# Patient Record
Sex: Male | Born: 1985 | Race: White | Hispanic: No | Marital: Married | Smoking: Never smoker
Health system: Southern US, Community
[De-identification: ages and names within clinical notes are randomized; demographics above are authoritative.]

## PROBLEM LIST (undated history)

## (undated) DIAGNOSIS — I10 Essential (primary) hypertension: Secondary | ICD-10-CM

## (undated) DIAGNOSIS — D497 Neoplasm of unspecified behavior of endocrine glands and other parts of nervous system: Secondary | ICD-10-CM

## (undated) HISTORY — PX: HAND TENDON SURGERY: SHX663

---
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
## 2007-06-28 ENCOUNTER — Ambulatory Visit: Payer: Self-pay | Admitting: Family Medicine

## 2007-06-28 DIAGNOSIS — J029 Acute pharyngitis, unspecified: Secondary | ICD-10-CM | POA: Insufficient documentation

## 2007-06-28 LAB — CONVERTED CEMR LAB: Rapid Strep: NEGATIVE

## 2007-07-09 ENCOUNTER — Ambulatory Visit: Payer: Self-pay | Admitting: Family Medicine

## 2007-07-09 ENCOUNTER — Encounter: Admission: RE | Admit: 2007-07-09 | Discharge: 2007-07-09 | Payer: Self-pay | Admitting: Family Medicine

## 2007-07-09 DIAGNOSIS — S6990XA Unspecified injury of unspecified wrist, hand and finger(s), initial encounter: Secondary | ICD-10-CM

## 2007-07-09 DIAGNOSIS — S6980XA Other specified injuries of unspecified wrist, hand and finger(s), initial encounter: Secondary | ICD-10-CM

## 2007-07-10 ENCOUNTER — Telehealth: Payer: Self-pay | Admitting: *Deleted

## 2007-07-17 ENCOUNTER — Encounter (INDEPENDENT_AMBULATORY_CARE_PROVIDER_SITE_OTHER): Payer: Self-pay | Admitting: *Deleted

## 2007-09-21 ENCOUNTER — Emergency Department (HOSPITAL_COMMUNITY): Admission: EM | Admit: 2007-09-21 | Discharge: 2007-09-22 | Payer: Self-pay | Admitting: Emergency Medicine

## 2007-12-19 ENCOUNTER — Ambulatory Visit: Payer: Self-pay | Admitting: Family Medicine

## 2008-02-04 ENCOUNTER — Encounter: Payer: Self-pay | Admitting: Family Medicine

## 2008-02-04 ENCOUNTER — Ambulatory Visit: Payer: Self-pay | Admitting: Family Medicine

## 2008-02-04 DIAGNOSIS — H6500 Acute serous otitis media, unspecified ear: Secondary | ICD-10-CM

## 2008-03-10 ENCOUNTER — Ambulatory Visit: Payer: Self-pay | Admitting: Family Medicine

## 2008-03-10 DIAGNOSIS — J1089 Influenza due to other identified influenza virus with other manifestations: Secondary | ICD-10-CM | POA: Insufficient documentation

## 2008-04-07 ENCOUNTER — Ambulatory Visit: Payer: Self-pay | Admitting: Family Medicine

## 2008-04-07 ENCOUNTER — Telehealth: Payer: Self-pay | Admitting: *Deleted

## 2008-04-07 DIAGNOSIS — M546 Pain in thoracic spine: Secondary | ICD-10-CM

## 2008-04-15 ENCOUNTER — Telehealth: Payer: Self-pay | Admitting: Family Medicine

## 2008-04-15 ENCOUNTER — Ambulatory Visit: Payer: Self-pay | Admitting: Family Medicine

## 2008-05-22 ENCOUNTER — Ambulatory Visit: Payer: Self-pay | Admitting: Family Medicine

## 2008-05-22 DIAGNOSIS — H02849 Edema of unspecified eye, unspecified eyelid: Secondary | ICD-10-CM | POA: Insufficient documentation

## 2008-06-16 ENCOUNTER — Ambulatory Visit: Payer: Self-pay | Admitting: Family Medicine

## 2008-06-16 DIAGNOSIS — M25519 Pain in unspecified shoulder: Secondary | ICD-10-CM

## 2008-06-18 ENCOUNTER — Encounter (INDEPENDENT_AMBULATORY_CARE_PROVIDER_SITE_OTHER): Payer: Self-pay | Admitting: *Deleted

## 2008-06-18 ENCOUNTER — Ambulatory Visit: Payer: Self-pay | Admitting: Sports Medicine

## 2008-06-18 DIAGNOSIS — M75120 Complete rotator cuff tear or rupture of unspecified shoulder, not specified as traumatic: Secondary | ICD-10-CM | POA: Insufficient documentation

## 2008-06-24 ENCOUNTER — Ambulatory Visit: Payer: Self-pay | Admitting: Sports Medicine

## 2008-07-14 ENCOUNTER — Ambulatory Visit: Payer: Self-pay | Admitting: Family Medicine

## 2008-07-14 DIAGNOSIS — R519 Headache, unspecified: Secondary | ICD-10-CM | POA: Insufficient documentation

## 2008-07-14 DIAGNOSIS — R51 Headache: Secondary | ICD-10-CM

## 2008-07-14 DIAGNOSIS — R030 Elevated blood-pressure reading, without diagnosis of hypertension: Secondary | ICD-10-CM

## 2008-08-10 ENCOUNTER — Telehealth: Payer: Self-pay | Admitting: Family Medicine

## 2008-09-11 ENCOUNTER — Ambulatory Visit: Payer: Self-pay | Admitting: Family Medicine

## 2008-09-11 DIAGNOSIS — H1045 Other chronic allergic conjunctivitis: Secondary | ICD-10-CM

## 2008-11-10 ENCOUNTER — Ambulatory Visit: Payer: Self-pay | Admitting: Family Medicine

## 2008-11-10 DIAGNOSIS — L0292 Furuncle, unspecified: Secondary | ICD-10-CM | POA: Insufficient documentation

## 2008-11-10 DIAGNOSIS — H9190 Unspecified hearing loss, unspecified ear: Secondary | ICD-10-CM | POA: Insufficient documentation

## 2008-11-10 DIAGNOSIS — L0293 Carbuncle, unspecified: Secondary | ICD-10-CM

## 2008-11-12 ENCOUNTER — Telehealth: Payer: Self-pay | Admitting: Family Medicine

## 2009-03-03 ENCOUNTER — Emergency Department (HOSPITAL_COMMUNITY): Admission: EM | Admit: 2009-03-03 | Discharge: 2009-03-03 | Payer: Self-pay | Admitting: Emergency Medicine

## 2009-12-22 ENCOUNTER — Emergency Department (HOSPITAL_COMMUNITY): Admission: EM | Admit: 2009-12-22 | Discharge: 2009-12-22 | Payer: Self-pay | Admitting: Emergency Medicine

## 2010-03-22 ENCOUNTER — Emergency Department (HOSPITAL_COMMUNITY)
Admission: EM | Admit: 2010-03-22 | Discharge: 2010-03-22 | Payer: Self-pay | Source: Home / Self Care | Admitting: Emergency Medicine

## 2010-05-07 ENCOUNTER — Emergency Department (HOSPITAL_COMMUNITY)
Admission: EM | Admit: 2010-05-07 | Discharge: 2010-05-07 | Disposition: A | Discharge status: Home / Self Care | Payer: Self-pay | Attending: Emergency Medicine | Admitting: Emergency Medicine

## 2010-05-07 DIAGNOSIS — M719 Bursopathy, unspecified: Secondary | ICD-10-CM | POA: Insufficient documentation

## 2010-05-07 DIAGNOSIS — I10 Essential (primary) hypertension: Secondary | ICD-10-CM | POA: Insufficient documentation

## 2010-05-07 DIAGNOSIS — M25519 Pain in unspecified shoulder: Secondary | ICD-10-CM | POA: Insufficient documentation

## 2010-05-07 DIAGNOSIS — M67919 Unspecified disorder of synovium and tendon, unspecified shoulder: Secondary | ICD-10-CM | POA: Insufficient documentation

## 2010-05-21 ENCOUNTER — Emergency Department (HOSPITAL_COMMUNITY): Payer: Self-pay

## 2010-05-21 ENCOUNTER — Emergency Department (HOSPITAL_COMMUNITY)
Admission: EM | Admit: 2010-05-21 | Discharge: 2010-05-21 | Disposition: A | Discharge status: Home / Self Care | Payer: Self-pay | Attending: Emergency Medicine | Admitting: Emergency Medicine

## 2010-05-21 DIAGNOSIS — S63509A Unspecified sprain of unspecified wrist, initial encounter: Secondary | ICD-10-CM | POA: Insufficient documentation

## 2010-05-21 DIAGNOSIS — IMO0002 Reserved for concepts with insufficient information to code with codable children: Secondary | ICD-10-CM | POA: Insufficient documentation

## 2010-06-30 ENCOUNTER — Emergency Department (HOSPITAL_COMMUNITY): Payer: Self-pay

## 2010-06-30 ENCOUNTER — Emergency Department (HOSPITAL_COMMUNITY)
Admission: EM | Admit: 2010-06-30 | Discharge: 2010-06-30 | Disposition: A | Discharge status: Home / Self Care | Payer: Self-pay | Attending: Emergency Medicine | Admitting: Emergency Medicine

## 2010-06-30 DIAGNOSIS — IMO0002 Reserved for concepts with insufficient information to code with codable children: Secondary | ICD-10-CM | POA: Insufficient documentation

## 2010-06-30 DIAGNOSIS — I10 Essential (primary) hypertension: Secondary | ICD-10-CM | POA: Insufficient documentation

## 2010-06-30 DIAGNOSIS — M25519 Pain in unspecified shoulder: Secondary | ICD-10-CM | POA: Insufficient documentation

## 2010-06-30 DIAGNOSIS — X58XXXA Exposure to other specified factors, initial encounter: Secondary | ICD-10-CM | POA: Insufficient documentation

## 2010-06-30 DIAGNOSIS — E669 Obesity, unspecified: Secondary | ICD-10-CM | POA: Insufficient documentation

## 2010-07-25 ENCOUNTER — Emergency Department (HOSPITAL_COMMUNITY)
Admission: EM | Admit: 2010-07-25 | Discharge: 2010-07-26 | Disposition: A | Discharge status: Home / Self Care | Payer: Self-pay | Attending: Emergency Medicine | Admitting: Emergency Medicine

## 2010-07-25 DIAGNOSIS — M549 Dorsalgia, unspecified: Secondary | ICD-10-CM | POA: Insufficient documentation

## 2010-07-25 DIAGNOSIS — Y9289 Other specified places as the place of occurrence of the external cause: Secondary | ICD-10-CM | POA: Insufficient documentation

## 2010-07-25 DIAGNOSIS — IMO0002 Reserved for concepts with insufficient information to code with codable children: Secondary | ICD-10-CM | POA: Insufficient documentation

## 2010-10-28 ENCOUNTER — Emergency Department: Payer: Self-pay | Admitting: Emergency Medicine

## 2011-03-04 ENCOUNTER — Emergency Department: Payer: Self-pay | Admitting: Emergency Medicine

## 2011-12-09 ENCOUNTER — Emergency Department: Payer: Self-pay | Admitting: Emergency Medicine

## 2012-03-17 ENCOUNTER — Emergency Department (HOSPITAL_COMMUNITY): Payer: Self-pay

## 2012-03-17 ENCOUNTER — Encounter (HOSPITAL_COMMUNITY): Payer: Self-pay | Admitting: *Deleted

## 2012-03-17 ENCOUNTER — Emergency Department (HOSPITAL_COMMUNITY)
Admission: EM | Admit: 2012-03-17 | Discharge: 2012-03-17 | Disposition: A | Discharge status: Home / Self Care | Payer: Self-pay | Attending: Emergency Medicine | Admitting: Emergency Medicine

## 2012-03-17 DIAGNOSIS — G43909 Migraine, unspecified, not intractable, without status migrainosus: Secondary | ICD-10-CM | POA: Insufficient documentation

## 2012-03-17 DIAGNOSIS — I10 Essential (primary) hypertension: Secondary | ICD-10-CM | POA: Insufficient documentation

## 2012-03-17 DIAGNOSIS — R112 Nausea with vomiting, unspecified: Secondary | ICD-10-CM | POA: Insufficient documentation

## 2012-03-17 DIAGNOSIS — R197 Diarrhea, unspecified: Secondary | ICD-10-CM | POA: Insufficient documentation

## 2012-03-17 DIAGNOSIS — R42 Dizziness and giddiness: Secondary | ICD-10-CM | POA: Insufficient documentation

## 2012-03-17 HISTORY — DX: Essential (primary) hypertension: I10

## 2012-03-17 LAB — COMPREHENSIVE METABOLIC PANEL
AST: 21 U/L (ref 0–37)
Albumin: 4.1 g/dL (ref 3.5–5.2)
Alkaline Phosphatase: 92 U/L (ref 39–117)
CO2: 25 mEq/L (ref 19–32)
Chloride: 99 mEq/L (ref 96–112)
Creatinine, Ser: 0.92 mg/dL (ref 0.50–1.35)
Glucose, Bld: 104 mg/dL — ABNORMAL HIGH (ref 70–99)
Potassium: 4.1 mEq/L (ref 3.5–5.1)
Sodium: 134 mEq/L — ABNORMAL LOW (ref 135–145)
Total Bilirubin: 0.3 mg/dL (ref 0.3–1.2)

## 2012-03-17 LAB — CBC
HCT: 46.6 % (ref 39.0–52.0)
Hemoglobin: 15.8 g/dL (ref 13.0–17.0)
MCH: 28.3 pg (ref 26.0–34.0)
MCV: 83.5 fL (ref 78.0–100.0)
WBC: 13.5 10*3/uL — ABNORMAL HIGH (ref 4.0–10.5)

## 2012-03-17 MED ORDER — DIPHENHYDRAMINE HCL 25 MG PO TABS: 25.0000 mg | ORAL_TABLET | Freq: Four times a day (QID) | ORAL | Status: DC

## 2012-03-17 MED ORDER — PROMETHAZINE HCL 25 MG/ML IJ SOLN
25.0000 mg | Freq: Once | INTRAMUSCULAR | Status: AC
  Administered 2012-03-17: 25 mg via INTRAVENOUS
  Filled 2012-03-17: qty 1

## 2012-03-17 MED ORDER — HYDROMORPHONE HCL PF 1 MG/ML IJ SOLN
1.0000 mg | Freq: Once | INTRAMUSCULAR | Status: AC
  Administered 2012-03-17: 1 mg via INTRAVENOUS
  Filled 2012-03-17: qty 1

## 2012-03-17 MED ORDER — SODIUM CHLORIDE 0.9 % IV BOLUS (SEPSIS)
1000.0000 mL | Freq: Once | INTRAVENOUS | Status: AC
  Administered 2012-03-17: 1000 mL via INTRAVENOUS

## 2012-03-17 MED ORDER — PROCHLORPERAZINE MALEATE 10 MG PO TABS: 10.0000 mg | ORAL_TABLET | Freq: Two times a day (BID) | ORAL | Status: DC | PRN

## 2012-03-17 NOTE — ED Provider Notes (Signed)
History     CSN: [id number]  Arrival date & time 03/17/12  4098   First MD Initiated Contact with Patient 03/17/12 0459      Chief Complaint  Patient presents with  . Headache  . Nausea  . Dizziness  . Emesis    (Consider location/radiation/quality/duration/timing/severity/associated sxs/prior treatment) HPI Anthony Wilcox is a 26 y.o. male presents with sudden onset HA 4 days ago, 10/10 at onset now 8-9/10, constant throbbing at the top of his head. He has had associated nausea vomiting and diarrhea for the last 2 days. He's also had some associated dizziness when the vomiting started. Denies balance problems, no numbness, tingling, weakness, dysarthria, vertigo.  No CP, dyspnea, abdominal pain or family h/o aneurysms.   Past Medical History  Diagnosis Date  . Hypertension     History reviewed. No pertinent past surgical history.  History reviewed. No pertinent family history.  History  Substance Use Topics  . Smoking status: Never Smoker   . Smokeless tobacco: Not on file  . Alcohol Use: Yes      Review of Systems At least 10pt or greater review of systems completed and are negative except where specified in the HPI.  Allergies  Review of patient's allergies indicates no known allergies.  Home Medications  No current outpatient prescriptions on file.  BP 145/83  Pulse 87  Temp 98.3 F (36.8 C)  Resp 20  Ht 6' (1.829 m)  Wt 316 lb (143.337 kg)  BMI 42.86 kg/m2  SpO2 97%  Physical Exam  PHYSICAL EXAM: VITAL SIGNS:  . Filed Vitals:   03/17/12 0439  BP: 145/83  Pulse: 87  Temp: 98.3 F (36.8 C)  Resp: 20  Height: 6' (1.829 m)  Weight: 316 lb (143.337 kg)  SpO2: 97%   CONSTITUTIONAL: Awake, oriented, appears non-toxic HENT: Atraumatic, normocephalic, oral mucosa pink and moist, airway patent. Nares patent without drainage. External ears normal. EYES: Conjunctiva clear, EOMI, PERRLA NECK: Trachea midline, non-tender,  supple CARDIOVASCULAR: Normal heart rate, Normal rhythm, No murmurs, rubs, gallops PULMONARY/CHEST: Clear to auscultation, no rhonchi, wheezes, or rales. Symmetrical breath sounds. CHEST WALL: No lesions. Non-tender. ABDOMINAL: Non-distended, morbidly obese, soft, non-tender - no rebound or guarding.  BS normal. NEUROLOGIC: JX:BJYNWG fields intact. PERRLA, EOMI.  Facial sensation equal to light touch bilaterally.  Good muscle bulk in the masseter muscle and good lateral movement of the jaw.  Facial expressions equal and good strength with smile/frown and puffed cheeks.  Hearing grossly intact to finger rub test.  Uvula, tongue are midline with no deviation. Symmetrical palate elevation.  Trapezius and SCM muscles are 5/5 strength bilaterally.   DTR: Brachioradialis, biceps, patellar, Achilles tendon reflexes 2+ bilaterally.  No clonus. Strength: 5/5 strength flexors and extensors in the upper and lower extremities.  Grip strength, finger adduction/abduction 5/5. Sensation: Sensation intact distally to light touch Cerebellar: No ataxia with walking or dysmetria with finger to nose, rapid alternating hand movements and heels to shin testing. EXTREMITIES: No clubbing, cyanosis, or edema SKIN: Warm, Dry, No erythema, No rash   ED Course  Procedures (including critical care time)  Labs Reviewed  CBC - Abnormal; Notable for the following:    WBC 13.5 (*)     All other components within normal limits  COMPREHENSIVE METABOLIC PANEL - Abnormal; Notable for the following:    Sodium 134 (*)     Glucose, Bld 104 (*)     All other components within normal limits  LAB REPORT - SCANNED  No results found. Ct Head Wo Contrast  03/17/2012  *RADIOLOGY REPORT*  Clinical Data: Headache, nausea and dizziness.  Vomiting.  CT HEAD WITHOUT CONTRAST  Technique:  Contiguous axial images were obtained from the base of the skull through the vertex without contrast.  Comparison: None.  Findings: There is no  evidence of acute infarction, mass lesion, or intra- or extra-axial hemorrhage on CT.  The posterior fossa, including the cerebellum, brainstem and fourth ventricle, is within normal limits.  The third and lateral ventricles, and basal ganglia are unremarkable in appearance.  The cerebral hemispheres are symmetric in appearance, with normal gray- white differentiation.  No mass effect or midline shift is seen.  There is no evidence of fracture; visualized osseous structures are unremarkable in appearance.  The orbits are within normal limits. The paranasal sinuses and mastoid air cells are well-aerated.  No significant soft tissue abnormalities are seen.  IMPRESSION: Unremarkable noncontrast CT of the head.   Original Report Authenticated By: Tonia Ghent, M.D.     1. Migraine headache   2. Nausea and vomiting   3. Diarrhea       MDM  Anthony Wilcox is a 26 y.o. male presenting with HA c/w migraine with concerning features of SAH.  Pt has had concomitant NVD c/w AGE, perhaps related to HA or not.  CT head is unremarkable.  Pt refuses LP because his sister had paraplegia after epidural 2/2 childbirth.  Pt understands there is a small risk of missed SAH and that could be fatal, he has capacity to make this decision.  Pt given fluids and migraine medicine.  He feels much better, HA is 3/10.  Pt would like to go home with Rx for migraine.  Conservative therapy for NVD.  Pt able to ambulate unassisted, normally.  No MRI or further testing needed at this time.  Non-toxic and afebrile, doubt meningitis. No spinal cord pathology.  I explained the diagnosis and have given explicit precautions to return to the ER including any other new or worsening symptoms. The patient understands and accepts the medical plan as it's been dictated and I have answered their questions. Discharge instructions concerning home care and prescriptions have been given.  The patient is STABLE and is discharged to home in good  condition.         Jones Skene, MD 03/19/12 2011

## 2012-03-17 NOTE — ED Notes (Signed)
Pt c/o headache x 4 days with n/v/d x 2days

## 2012-04-16 ENCOUNTER — Emergency Department (HOSPITAL_COMMUNITY)
Admission: EM | Admit: 2012-04-16 | Discharge: 2012-04-16 | Disposition: A | Discharge status: Home / Self Care | Payer: Self-pay | Attending: Emergency Medicine | Admitting: Emergency Medicine

## 2012-04-16 ENCOUNTER — Encounter (HOSPITAL_COMMUNITY): Payer: Self-pay | Admitting: *Deleted

## 2012-04-16 DIAGNOSIS — G479 Sleep disorder, unspecified: Secondary | ICD-10-CM | POA: Insufficient documentation

## 2012-04-16 DIAGNOSIS — I10 Essential (primary) hypertension: Secondary | ICD-10-CM | POA: Insufficient documentation

## 2012-04-16 DIAGNOSIS — J069 Acute upper respiratory infection, unspecified: Secondary | ICD-10-CM | POA: Insufficient documentation

## 2012-04-16 DIAGNOSIS — J3489 Other specified disorders of nose and nasal sinuses: Secondary | ICD-10-CM | POA: Insufficient documentation

## 2012-04-16 DIAGNOSIS — J029 Acute pharyngitis, unspecified: Secondary | ICD-10-CM | POA: Insufficient documentation

## 2012-04-16 NOTE — ED Provider Notes (Signed)
History   This chart was scribed for Ward Givens, MD by Gerlean Ren, ED Scribe. This patient was seen in room APA03/[facility]03 and the patient's care was started at 7:19 AM    CSN: [id number]  Arrival date & time 04/16/12  4098   First MD Initiated Contact with Patient 04/16/12 [phone]      Chief Complaint  Patient presents with  . Cough    The history is provided by the patient. No language interpreter was used.   Ric D Heinzman is a 27 y.o. male with h/o HTN who presents to the Emergency Department complaining of 3 days of constant dry cough causing a sore throat secondary to cough and difficulty sleeping with associated mild clear rhinorrhea.  Pt denies fever, chest pain, nausea, emesis, diarrhea, dyspnea, wheezes, difficulty eating or drinking.  Pt denies known sick contacts.  Nyquil used with no improvements to sleep.  No HTN medications.  Pt denies tobacco use but reports occasional alcohol use.   PCP none  Past Medical History  Diagnosis Date  . Hypertension   no medications  History reviewed. No pertinent past surgical history.  No family history on file.  History  Substance Use Topics  . Smoking status: Never Smoker   . Smokeless tobacco: Not on file  . Alcohol Use: Yes     Comment: occasional    Employed in a nursing home   Review of Systems  Constitutional: Negative for fever and appetite change.  HENT: Positive for sore throat and rhinorrhea.   Respiratory: Positive for cough. Negative for shortness of breath and wheezing.   Cardiovascular: Negative for chest pain.  Gastrointestinal: Negative for nausea, vomiting and diarrhea.  All other systems reviewed and are negative.    Allergies  Review of patient's allergies indicates no known allergies.  Home Medications   Current Outpatient Rx  Name  Route  Sig  Dispense  Refill  . DIPHENHYDRAMINE HCL 25 MG PO TABS   Oral   Take 1 tablet (25 mg total) by mouth every 6 (six) hours.   20 tablet   0   .  PROCHLORPERAZINE MALEATE 10 MG PO TABS   Oral   Take 1 tablet (10 mg total) by mouth 2 (two) times daily as needed. For headache, nausea or vomiting.  Take with a benadryl tablet (25mg )   20 tablet   0     BP 161/90  Pulse 89  Temp 97.8 F (36.6 C) (Oral)  Resp 20  Ht 5\' 10"  (1.778 m)  Wt 300 lb (136.079 kg)  BMI 43.05 kg/m2  SpO2 98%  Vital signs normal except hypertension   Physical Exam  Nursing note and vitals reviewed. Constitutional: He is oriented to person, place, and time. He appears well-developed and well-nourished.  Non-toxic appearance. He does not appear ill. No distress.  HENT:  Head: Normocephalic and atraumatic.  Right Ear: External ear normal.  Left Ear: External ear normal.  Nose: Nose normal. No mucosal edema or rhinorrhea.  Mouth/Throat: Oropharynx is clear and moist and mucous membranes are normal. No dental abscesses or uvula swelling.  Eyes: Conjunctivae normal and EOM are normal. Pupils are equal, round, and reactive to light.  Neck: Normal range of motion and full passive range of motion without pain. Neck supple.  Cardiovascular: Normal rate, regular rhythm and normal heart sounds.  Exam reveals no gallop and no friction rub.   No murmur heard. Pulmonary/Chest: Effort normal and breath sounds normal. No respiratory distress. He  has no wheezes. He has no rhonchi. He has no rales. He exhibits no tenderness and no crepitus.  Abdominal: Normal appearance.  Musculoskeletal: Normal range of motion. He exhibits no edema and no tenderness.       Moves all extremities well.   Lymphadenopathy:    He has no cervical adenopathy.  Neurological: He is alert and oriented to person, place, and time. He has normal strength. No cranial nerve deficit.  Skin: Skin is warm, dry and intact. No rash noted. No erythema. No pallor.  Psychiatric: He has a normal mood and affect. His speech is normal and behavior is normal. His mood appears not anxious.    ED Course    Procedures (including critical care time) DIAGNOSTIC STUDIES: Oxygen Saturation is 98% on room air, normal by my interpretation.    COORDINATION OF CARE: 7:26 AM- Patient informed of clinical course, understands medical decision-making process, and agrees with plan.  Informed pt of probable cold and that flu is unlikely.  1. URI (upper respiratory infection)     OTC mucinex DM  Plan discharge  Devoria Albe, MD, FACEP   MDM   I personally performed the services described in this documentation, which was scribed in my presence. The recorded information has been reviewed and considered.  Devoria Albe, MD, FACEP         Ward Givens, MD 04/16/12 [phone]

## 2012-04-16 NOTE — ED Notes (Signed)
Non productive cough with nasal congestion x 3 days.   Denies fever.  States sore throat only with coughing.

## 2012-05-07 ENCOUNTER — Emergency Department (HOSPITAL_COMMUNITY)
Admission: EM | Admit: 2012-05-07 | Discharge: 2012-05-07 | Disposition: A | Discharge status: Home / Self Care | Payer: Self-pay | Attending: Emergency Medicine | Admitting: Emergency Medicine

## 2012-05-07 ENCOUNTER — Encounter (HOSPITAL_COMMUNITY): Payer: Self-pay | Admitting: *Deleted

## 2012-05-07 DIAGNOSIS — R509 Fever, unspecified: Secondary | ICD-10-CM | POA: Insufficient documentation

## 2012-05-07 DIAGNOSIS — I10 Essential (primary) hypertension: Secondary | ICD-10-CM | POA: Insufficient documentation

## 2012-05-07 DIAGNOSIS — R1032 Left lower quadrant pain: Secondary | ICD-10-CM | POA: Insufficient documentation

## 2012-05-07 DIAGNOSIS — K529 Noninfective gastroenteritis and colitis, unspecified: Secondary | ICD-10-CM

## 2012-05-07 DIAGNOSIS — R51 Headache: Secondary | ICD-10-CM | POA: Insufficient documentation

## 2012-05-07 DIAGNOSIS — K5289 Other specified noninfective gastroenteritis and colitis: Secondary | ICD-10-CM | POA: Insufficient documentation

## 2012-05-07 DIAGNOSIS — R197 Diarrhea, unspecified: Secondary | ICD-10-CM | POA: Insufficient documentation

## 2012-05-07 LAB — URINALYSIS, ROUTINE W REFLEX MICROSCOPIC
Hgb urine dipstick: NEGATIVE
Leukocytes, UA: NEGATIVE
Nitrite: NEGATIVE
Protein, ur: NEGATIVE mg/dL
Specific Gravity, Urine: 1.02 (ref 1.005–1.030)
pH: 6.5 (ref 5.0–8.0)

## 2012-05-07 LAB — CBC WITH DIFFERENTIAL/PLATELET
Basophils Relative: 0 % (ref 0–1)
Eosinophils Relative: 1 % (ref 0–5)
HCT: 43.1 % (ref 39.0–52.0)
Hemoglobin: 14.6 g/dL (ref 13.0–17.0)
Lymphocytes Relative: 20 % (ref 12–46)
Lymphs Abs: 2.7 10*3/uL (ref 0.7–4.0)
MCHC: 33.9 g/dL (ref 30.0–36.0)
Neutro Abs: 9.2 10*3/uL — ABNORMAL HIGH (ref 1.7–7.7)
RBC: 5.19 MIL/uL (ref 4.22–5.81)
RDW: 13.7 % (ref 11.5–15.5)

## 2012-05-07 LAB — COMPREHENSIVE METABOLIC PANEL
ALT: 25 U/L (ref 0–53)
Albumin: 3.9 g/dL (ref 3.5–5.2)
Creatinine, Ser: 0.86 mg/dL (ref 0.50–1.35)
GFR calc Af Amer: >90 mL/min (ref >90)
Glucose, Bld: 117 mg/dL — ABNORMAL HIGH (ref 70–99)
Total Bilirubin: 0.3 mg/dL (ref 0.3–1.2)

## 2012-05-07 MED ORDER — SODIUM CHLORIDE 0.9 % IV BOLUS (SEPSIS)
1000.0000 mL | Freq: Once | INTRAVENOUS | Status: AC
  Administered 2012-05-07: 1000 mL via INTRAVENOUS

## 2012-05-07 MED ORDER — ONDANSETRON HCL 4 MG PO TABS: 4.0000 mg | ORAL_TABLET | Freq: Four times a day (QID) | ORAL | Status: DC

## 2012-05-07 MED ORDER — ONDANSETRON HCL 4 MG/2ML IJ SOLN
4.0000 mg | Freq: Once | INTRAMUSCULAR | Status: AC
  Administered 2012-05-07: 4 mg via INTRAVENOUS
  Filled 2012-05-07: qty 2

## 2012-05-07 NOTE — ED Notes (Signed)
NVD, onset 1 am.  With abd pain

## 2012-05-07 NOTE — ED Provider Notes (Signed)
Medical screening examination/treatment/procedure(s) were performed by non-physician practitioner and as supervising physician I was immediately available for consultation/collaboration.   Benny Lennert, MD 05/07/12 1535

## 2012-05-07 NOTE — ED Notes (Signed)
Pt appears in no acute distress, talking on his cell phone.

## 2012-05-07 NOTE — ED Notes (Signed)
Patient given a drink per RN approval. 

## 2012-05-07 NOTE — ED Provider Notes (Signed)
History     CSN: [id number]  Arrival date & time 05/07/12  1228   None     Chief Complaint  Patient presents with  . Emesis    HPI Anthony Wilcox is a 27 y.o. male who presents to the ED with abdominal pain. The pain is mild and in the LLQ.  The pain started approximately 1 am. Associated symptoms included nausea, vomiting and diarrhea and low grade fever. Has vomited x 3. Diarrhea several times and is watery brown. The history was provided by the patient .  Past Medical History  Diagnosis Date  . Hypertension     Past Surgical History  Procedure Laterality Date  . Hand tendon surgery      History reviewed. No pertinent family history.  History  Substance Use Topics  . Smoking status: Never Smoker   . Smokeless tobacco: Not on file  . Alcohol Use: Yes     Comment: occasional       Review of Systems  Constitutional: Positive for fever. Negative for chills, diaphoresis and fatigue.  HENT: Negative for ear pain, congestion, sore throat, facial swelling, neck pain, neck stiffness, dental problem and sinus pressure.   Eyes: Negative for photophobia, pain, discharge and visual disturbance.  Respiratory: Negative for cough, chest tightness and wheezing.   Cardiovascular: Negative for chest pain.  Gastrointestinal: Positive for nausea, vomiting, abdominal pain and diarrhea. Negative for constipation and abdominal distention.  Genitourinary: Negative for dysuria, frequency, flank pain and difficulty urinating.  Musculoskeletal: Negative for myalgias, back pain and gait problem.  Skin: Negative for color change and rash.  Neurological: Positive for headaches. Negative for dizziness, speech difficulty, weakness, light-headedness and numbness.  Psychiatric/Behavioral: Negative for confusion and agitation. The patient is not nervous/anxious.     Allergies  Review of patient's allergies indicates no known allergies.  Home Medications   Current Outpatient Rx  Name   Route  Sig  Dispense  Refill  . diphenhydrAMINE (BENADRYL) 25 MG tablet   Oral   Take 1 tablet (25 mg total) by mouth every 6 (six) hours.   20 tablet   0   . prochlorperazine (COMPAZINE) 10 MG tablet   Oral   Take 1 tablet (10 mg total) by mouth 2 (two) times daily as needed. For headache, nausea or vomiting.  Take with a benadryl tablet (25mg )   20 tablet   0     BP 129/74  Pulse 93  Temp(Src) 98.1 F (36.7 C) (Oral)  Resp 20  Ht 5\' 10"  (1.778 m)  Wt 300 lb (136.079 kg)  BMI 43.05 kg/m2  SpO2 98%  Physical Exam  Nursing note and vitals reviewed. Constitutional: He is oriented to person, place, and time. He appears well-developed and well-nourished. No distress.  HENT:  Head: Normocephalic and atraumatic.  Eyes: EOM are normal. Pupils are equal, round, and reactive to light.  Neck: Neck supple.  Pulmonary/Chest: Effort normal and breath sounds normal.  Abdominal: Soft. There is tenderness (mildly tender LLQ). There is no rebound and no guarding.  Musculoskeletal: Normal range of motion. He exhibits no edema.  Neurological: He is alert and oriented to person, place, and time. No cranial nerve deficit.  Skin: Skin is warm and dry.  Psychiatric: He has a normal mood and affect. His behavior is normal. Judgment and thought content normal.   Results for orders placed during the hospital encounter of 05/07/12 (from the past 24 hour(s))  CBC WITH DIFFERENTIAL     Status:  Abnormal   Collection Time    05/07/12  1:20 PM      Result Value Range   WBC 13.0 (*) 4.0 - 10.5 K/uL   RBC 5.19  4.22 - 5.81 MIL/uL   Hemoglobin 14.6  13.0 - 17.0 g/dL   HCT 16.1  09.6 - 04.5 %   MCV 83.0  78.0 - 100.0 fL   MCH 28.1  26.0 - 34.0 pg   MCHC 33.9  30.0 - 36.0 g/dL   RDW 40.9  81.1 - 91.4 %   Platelets 305  150 - 400 K/uL   Neutrophils Relative 71  43 - 77 %   Neutro Abs 9.2 (*) 1.7 - 7.7 K/uL   Lymphocytes Relative 20  12 - 46 %   Lymphs Abs 2.7  0.7 - 4.0 K/uL   Monocytes Relative 8   3 - 12 %   Monocytes Absolute 1.0  0.1 - 1.0 K/uL   Eosinophils Relative 1  0 - 5 %   Eosinophils Absolute 0.1  0.0 - 0.7 K/uL   Basophils Relative 0  0 - 1 %   Basophils Absolute 0.0  0.0 - 0.1 K/uL  COMPREHENSIVE METABOLIC PANEL     Status: Abnormal   Collection Time    05/07/12  1:20 PM      Result Value Range   Sodium 137  135 - 145 mEq/L   Potassium 3.8  3.5 - 5.1 mEq/L   Chloride 101  96 - 112 mEq/L   CO2 24  19 - 32 mEq/L   Glucose, Bld 117 (*) 70 - 99 mg/dL   BUN 12  6 - 23 mg/dL   Creatinine, Ser 7.82  0.50 - 1.35 mg/dL   Calcium 9.3  8.4 - 95.6 mg/dL   Total Protein 7.3  6.0 - 8.3 g/dL   Albumin 3.9  3.5 - 5.2 g/dL   AST 20  0 - 37 U/L   ALT 25  0 - 53 U/L   Alkaline Phosphatase 84  39 - 117 U/L   Total Bilirubin 0.3  0.3 - 1.2 mg/dL   GFR calc non Af Amer >90  >90 mL/min   GFR calc Af Amer >90  >90 mL/min  URINALYSIS, ROUTINE W REFLEX MICROSCOPIC     Status: None   Collection Time    05/07/12  1:37 PM      Result Value Range   Color, Urine YELLOW  YELLOW   APPearance CLEAR  CLEAR   Specific Gravity, Urine 1.020  1.005 - 1.030   pH 6.5  5.0 - 8.0   Glucose, UA NEGATIVE  NEGATIVE mg/dL   Hgb urine dipstick NEGATIVE  NEGATIVE   Bilirubin Urine NEGATIVE  NEGATIVE   Ketones, ur NEGATIVE  NEGATIVE mg/dL   Protein, ur NEGATIVE  NEGATIVE mg/dL   Urobilinogen, UA 0.2  0.0 - 1.0 mg/dL   Nitrite NEGATIVE  NEGATIVE   Leukocytes, UA NEGATIVE  NEGATIVE    Procedures   14:55 after IV hydration and zofran the patient is feeling much better and states he is ready to go home.   Assessment: 27 y.o. male with viral gastroenteritis  Plan:  Rest, increase fluids, Zofran for nausea   Work note, return as needed  Discussed with the patient and all questioned fully answered. He will return if any problems arise.    Medication List    TAKE these medications       ondansetron 4 MG tablet  Commonly known as:  ZOFRAN  Take 1 tablet (4 mg total) by mouth every 6 (six) hours.          [facility] Orlene Och, [place] 05/07/12 1501

## 2012-05-07 NOTE — ED Notes (Signed)
Patient states he started having vomiting and diarrhea last night, reports fever of 101.2 today.  Last vomiting episode was today at 3 pm, states last diarrhea episode was 45 minutes ago.

## 2012-10-12 ENCOUNTER — Emergency Department (HOSPITAL_COMMUNITY): Payer: Self-pay

## 2012-10-12 ENCOUNTER — Encounter (HOSPITAL_COMMUNITY): Payer: Self-pay | Admitting: Emergency Medicine

## 2012-10-12 ENCOUNTER — Emergency Department (HOSPITAL_COMMUNITY)
Admission: EM | Admit: 2012-10-12 | Discharge: 2012-10-12 | Disposition: A | Discharge status: Home / Self Care | Payer: Self-pay | Attending: Emergency Medicine | Admitting: Emergency Medicine

## 2012-10-12 DIAGNOSIS — Y929 Unspecified place or not applicable: Secondary | ICD-10-CM | POA: Insufficient documentation

## 2012-10-12 DIAGNOSIS — Y939 Activity, unspecified: Secondary | ICD-10-CM | POA: Insufficient documentation

## 2012-10-12 DIAGNOSIS — W1809XA Striking against other object with subsequent fall, initial encounter: Secondary | ICD-10-CM | POA: Insufficient documentation

## 2012-10-12 DIAGNOSIS — S20212A Contusion of left front wall of thorax, initial encounter: Secondary | ICD-10-CM

## 2012-10-12 DIAGNOSIS — S20219A Contusion of unspecified front wall of thorax, initial encounter: Secondary | ICD-10-CM | POA: Insufficient documentation

## 2012-10-12 DIAGNOSIS — I10 Essential (primary) hypertension: Secondary | ICD-10-CM | POA: Insufficient documentation

## 2012-10-12 MED ORDER — BACLOFEN 10 MG PO TABS: 10.0000 mg | ORAL_TABLET | Freq: Three times a day (TID) | ORAL | Status: AC

## 2012-10-12 MED ORDER — HYDROCODONE-ACETAMINOPHEN 5-325 MG PO TABS: 1.0000 | ORAL_TABLET | ORAL | Status: DC | PRN

## 2012-10-12 MED ORDER — IBUPROFEN 800 MG PO TABS
800.0000 mg | ORAL_TABLET | Freq: Three times a day (TID) | ORAL | Status: DC
Start: 2012-10-12 — End: 2014-01-15

## 2012-10-12 NOTE — ED Notes (Signed)
Pt c/o left rib pain after falling on metal bar yesterday. nad noted.

## 2012-10-12 NOTE — ED Provider Notes (Signed)
CSN: [id number]     Arrival date & time 10/12/12  1541 History     First MD Initiated Contact with Patient 10/12/12 1616     Chief Complaint  Patient presents with  . Fall  . Rib Injury   (Consider location/radiation/quality/duration/timing/severity/associated sxs/prior Treatment) Patient is a 27 y.o. male presenting with fall. The history is provided by the patient.  Fall This is a new problem. The current episode started yesterday. The problem occurs daily. The problem has been gradually worsening. Pertinent negatives include no abdominal pain, arthralgias, change in bowel habit, chest pain, coughing, fever, neck pain or numbness. The symptoms are aggravated by bending. He has tried nothing for the symptoms. The treatment provided no relief.    Past Medical History  Diagnosis Date  . Hypertension    Past Surgical History  Procedure Laterality Date  . Hand tendon surgery     No family history on file. History  Substance Use Topics  . Smoking status: Never Smoker   . Smokeless tobacco: Not on file  . Alcohol Use: Yes     Comment: occasional     Review of Systems  Constitutional: Negative for fever and activity change.       All ROS Neg except as noted in HPI  HENT: Negative for nosebleeds and neck pain.   Eyes: Negative for photophobia and discharge.  Respiratory: Negative for cough, shortness of breath and wheezing.   Cardiovascular: Negative for chest pain and palpitations.  Gastrointestinal: Negative for abdominal pain, blood in stool and change in bowel habit.  Genitourinary: Negative for dysuria, frequency and hematuria.  Musculoskeletal: Negative for back pain and arthralgias.  Skin: Negative.   Neurological: Negative for dizziness, seizures, speech difficulty and numbness.  Psychiatric/Behavioral: Negative for hallucinations and confusion.    Allergies  Review of patient's allergies indicates no known allergies.  Home Medications   Current Outpatient Rx    Name  Route  Sig  Dispense  Refill  . ondansetron (ZOFRAN) 4 MG tablet   Oral   Take 1 tablet (4 mg total) by mouth every 6 (six) hours.   12 tablet   0    BP 149/86  Pulse 101  Temp(Src) 98.8 F (37.1 C)  Resp 18  Ht 5\' 11"  (1.803 m)  Wt 310 lb (140.615 kg)  BMI 43.26 kg/m2  SpO2 100% Physical Exam  Nursing note and vitals reviewed. Constitutional: He is oriented to person, place, and time. He appears well-developed and well-nourished.  Non-toxic appearance.  HENT:  Head: Normocephalic.  Right Ear: Tympanic membrane and external ear normal.  Left Ear: Tympanic membrane and external ear normal.  Eyes: EOM and lids are normal. Pupils are equal, round, and reactive to light.  Neck: Normal range of motion. Neck supple. Carotid bruit is not present.  Cardiovascular: Regular rhythm, normal heart sounds, intact distal pulses and normal pulses.  Tachycardia present.   Pulmonary/Chest: Breath sounds normal. No respiratory distress.    Abdominal: Soft. Bowel sounds are normal. There is no tenderness. There is no guarding.  Musculoskeletal: Normal range of motion.  Lymphadenopathy:       Head (right side): No submandibular adenopathy present.       Head (left side): No submandibular adenopathy present.    He has no cervical adenopathy.  Neurological: He is alert and oriented to person, place, and time. He has normal strength. No cranial nerve deficit or sensory deficit.  Skin: Skin is warm and dry.  Psychiatric: He has a  normal mood and affect. His speech is normal.    ED Course   Procedures (including critical care time)  Labs Reviewed - No data to display No results found. No diagnosis found.  MDM  *I have reviewed nursing notes, vital signs, and all appropriate lab and imaging results for this patient.** Pain of the left ribs after falling on 7/24. Xray of the chest and ribs is negative for acute problem. Rx for baclofen, norco, and ibuprofen given to the patient. Pt  will follow up with PCP or return to the ED if not improving.  Kathie Dike, PA-C 10/15/12 0020

## 2012-10-23 NOTE — ED Provider Notes (Signed)
Medical screening examination/treatment/procedure(s) were performed by non-physician practitioner and as supervising physician I was immediately available for consultation/collaboration. Devoria Albe, MD, [facility]   Ward Givens, MD 10/23/12 [phone]

## 2013-07-22 ENCOUNTER — Emergency Department (HOSPITAL_COMMUNITY)
Admission: EM | Admit: 2013-07-22 | Discharge: 2013-07-22 | Disposition: A | Discharge status: Home / Self Care | Payer: Medicaid Other | Attending: Emergency Medicine | Admitting: Emergency Medicine

## 2013-07-22 ENCOUNTER — Encounter (HOSPITAL_COMMUNITY): Payer: Self-pay | Admitting: Emergency Medicine

## 2013-07-22 ENCOUNTER — Emergency Department (HOSPITAL_COMMUNITY): Payer: Medicaid Other

## 2013-07-22 DIAGNOSIS — E669 Obesity, unspecified: Secondary | ICD-10-CM | POA: Insufficient documentation

## 2013-07-22 DIAGNOSIS — Y9239 Other specified sports and athletic area as the place of occurrence of the external cause: Secondary | ICD-10-CM | POA: Insufficient documentation

## 2013-07-22 DIAGNOSIS — Y9372 Activity, wrestling: Secondary | ICD-10-CM | POA: Insufficient documentation

## 2013-07-22 DIAGNOSIS — M25569 Pain in unspecified knee: Secondary | ICD-10-CM | POA: Insufficient documentation

## 2013-07-22 DIAGNOSIS — Z6841 Body Mass Index (BMI) 40.0 and over, adult: Secondary | ICD-10-CM | POA: Insufficient documentation

## 2013-07-22 DIAGNOSIS — Y92838 Other recreation area as the place of occurrence of the external cause: Secondary | ICD-10-CM

## 2013-07-22 DIAGNOSIS — X500XXA Overexertion from strenuous movement or load, initial encounter: Secondary | ICD-10-CM | POA: Insufficient documentation

## 2013-07-22 DIAGNOSIS — Y999 Unspecified external cause status: Secondary | ICD-10-CM | POA: Insufficient documentation

## 2013-07-22 DIAGNOSIS — S8991XA Unspecified injury of right lower leg, initial encounter: Secondary | ICD-10-CM

## 2013-07-22 MED ORDER — MELOXICAM 7.5 MG PO TABS: 7.5000 mg | ORAL_TABLET | Freq: Every day | ORAL | Status: DC

## 2013-07-22 MED ORDER — OXYCODONE-ACETAMINOPHEN 5-325 MG PO TABS
1.0000 | ORAL_TABLET | Freq: Once | ORAL | Status: AC
  Administered 2013-07-22: 1 via ORAL
  Filled 2013-07-22: qty 1

## 2013-07-22 MED ORDER — OXYCODONE-ACETAMINOPHEN 5-325 MG PO TABS: 2.0000 | ORAL_TABLET | ORAL | Status: DC | PRN

## 2013-07-22 MED ORDER — OXYCODONE-ACETAMINOPHEN 7.5-325 MG PO TABS: 1.0000 | ORAL_TABLET | ORAL | Status: DC | PRN

## 2013-07-22 NOTE — ED Notes (Signed)
Rt thigh pain since wrestling practice on Wednesday.

## 2013-07-22 NOTE — ED Notes (Signed)
Pt was wrestling practice last night and felt a pop above right knee in thigh, pt unable to straighten leg out, c/o pain since and unable to put all of weight on right thigh since

## 2013-07-22 NOTE — ED Provider Notes (Signed)
CSN: [id number]     Arrival date & time 07/22/13  2040 History   First MD Initiated Contact with Patient 07/22/13 2103     Chief Complaint  Patient presents with  . Leg Pain     (Consider location/radiation/quality/duration/timing/severity/associated sxs/prior Treatment) Patient is a 28 y.o. male presenting with leg pain. The history is provided by the patient.  Leg Pain Location:  Knee Time since incident:  3 days Injury: yes   Mechanism of injury comment:  Wrestling Knee location:  R knee Pain details:    Quality:  Sharp and tearing   Radiates to:  R leg   Severity:  Severe   Onset quality:  Sudden   Timing:  Constant   Progression:  Worsening Chronicity:  New Dislocation: no   Foreign body present:  No foreign bodies Tetanus status:  Up to date Prior injury to area:  No Relieved by:  Nothing Worsened by:  Activity, bearing weight and flexion Ineffective treatments:  NSAIDs and ice Associated symptoms: decreased ROM and swelling   Associated symptoms: no back pain, no fever, no neck pain, no numbness and no tingling   Risk factors: obesity    Anthony Wilcox is a 28 y.o. male who presents to the ED with right knee pain that started 3 days ago after he was wrestling. He has been unable to flex the knee since the injury. The pain has become severe with any pressure or ambulation. The pain is located above the knee cap and radiates the the upper leg. The pain is severe. He has taken Advil, applied ice and rested the area without any relief. He is ambulatory to the ED. PMH significant for morbid obesity. Patient states he weighed over 600 pounds and has lost to 320.   Past Medical History  Diagnosis Date  . Hypertension    Past Surgical History  Procedure Laterality Date  . Hand tendon surgery     History reviewed. No pertinent family history. History  Substance Use Topics  . Smoking status: Never Smoker   . Smokeless tobacco: Not on file  . Alcohol Use: Yes    Comment: occasional     Review of Systems  Constitutional: Negative for fever.  HENT: Negative.   Eyes: Negative for visual disturbance.  Respiratory: Negative for cough and shortness of breath.   Cardiovascular: Negative for chest pain.  Gastrointestinal: Negative for nausea, vomiting and abdominal pain.  Genitourinary: Negative for dysuria, urgency and frequency.  Musculoskeletal: Negative for back pain and neck pain.       Right knee knee and thigh pain  Skin: Negative for rash and wound.  Neurological: Negative for syncope and headaches.  Psychiatric/Behavioral: Negative for confusion. The patient is not nervous/anxious.       Allergies  Bee venom  Home Medications   Prior to Admission medications   Medication Sig Start Date End Date Taking? Authorizing Provider  HYDROcodone-acetaminophen (NORCO/VICODIN) 5-325 MG per tablet Take 1 tablet by mouth every 4 (four) hours as needed for pain. 10/12/12   Lenox Ahr, PA-C  ibuprofen (ADVIL,MOTRIN) 800 MG tablet Take 1 tablet (800 mg total) by mouth 3 (three) times daily. 10/12/12   Lenox Ahr, PA-C   BP 164/87  Pulse 102  Temp(Src) 98.3 F (36.8 C) (Oral)  Resp 24  Ht 5\' 11"  (1.803 m)  Wt 320 lb (145.151 kg)  BMI 44.65 kg/m2  SpO2 99% Physical Exam  Nursing note and vitals reviewed. Constitutional: He is oriented to  person, place, and time. He appears well-developed and well-nourished. No distress.  HENT:  Head: Normocephalic.  Eyes: EOM are normal.  Neck: Normal range of motion. Neck supple.  Cardiovascular: Normal rate.   Pulmonary/Chest: Effort normal.  Musculoskeletal:       Right knee: He exhibits decreased range of motion and swelling. He exhibits no laceration and no erythema. Tenderness found. Patellar tendon tenderness noted.  Patient unable to flex his right knee. Severe pain with passive range of motion, flexion and palpation. Pedal pulses equal, adequate circulation, good touch sensation. No high  ridding patella. Patient ambulatory to the ED.   Neurological: He is alert and oriented to person, place, and time. He has normal strength. No cranial nerve deficit or sensory deficit. Abnormal gait: due to pain.  Skin: Skin is warm and dry.  Psychiatric: He has a normal mood and affect. His behavior is normal.   Dg Knee Complete 4 Views Right  07/22/2013   CLINICAL DATA:  Knee pain, wrestling injury.  EXAM: RIGHT KNEE - COMPLETE 4+ VIEW  COMPARISON:  None.  FINDINGS: There is no evidence of fracture, dislocation, or joint effusion. There is no evidence of arthropathy or other focal bone abnormality. Soft tissues are unremarkable.  IMPRESSION: Negative.   Electronically Signed   By: Elon Alas   On: 07/22/2013 22:37    ED Course  Procedures   MDM  28 y.o. obese male with right knee pain that radiates to the anterior aspect of the right thigh s/p injury 3 days ago. Because of the patient's size we are unable to fit him with a knee immobilizer. Ace wrap applied and Rx written for bariatric crutches. He will elevate, ice and follow up with ortho tomorrow. I have reviewed this patient's vital signs, nurses notes, appropriate labs and imaging.  I have discussed findings and plan of care with the patient and he voices understanding. Stable for discharge without neurovascular deficits.    Medication List    TAKE these medications       meloxicam 7.5 MG tablet  Commonly known as:  MOBIC  Take 1 tablet (7.5 mg total) by mouth daily.     oxyCODONE-acetaminophen 7.5-325 MG per tablet  Commonly known as:  PERCOCET  Take 1 tablet by mouth every 4 (four) hours as needed for pain.     oxyCODONE-acetaminophen 5-325 MG per tablet  Commonly known as:  PERCOCET/ROXICET  Take 2 tablets by mouth every 4 (four) hours as needed for severe pain.      ASK your doctor about these medications       HYDROcodone-acetaminophen 5-325 MG per tablet  Commonly known as:  NORCO/VICODIN  Take 1 tablet by mouth  every 4 (four) hours as needed for pain.     ibuprofen 800 MG tablet  Commonly known as:  ADVIL,MOTRIN  Take 1 tablet (800 mg total) by mouth 3 (three) times daily.          Ashley Murrain, NP 07/23/13 1225

## 2013-07-22 NOTE — Discharge Instructions (Signed)
Call Dr. Brooke Bonito office in to morning to schedule follow up. Apply ice to the area, elevate and rest. Do not take the narcotic if you are driving as it will make you sleepy.

## 2013-07-23 NOTE — ED Provider Notes (Signed)
Medical screening examination/treatment/procedure(s) were performed by non-physician practitioner and as supervising physician I was immediately available for consultation/collaboration.   EKG Interpretation None      Medical screening examination/treatment/procedure(s) were performed by non-physician practitioner and as supervising physician I was immediately available for consultation/collaboration.   EKG Interpretation None        Maudry Diego, MD 07/26/13 1527

## 2013-11-11 ENCOUNTER — Emergency Department (HOSPITAL_COMMUNITY)
Admission: EM | Admit: 2013-11-11 | Discharge: 2013-11-11 | Disposition: A | Discharge status: Home / Self Care | Payer: Medicaid Other | Attending: Emergency Medicine | Admitting: Emergency Medicine

## 2013-11-11 ENCOUNTER — Encounter (HOSPITAL_COMMUNITY): Payer: Self-pay | Admitting: Emergency Medicine

## 2013-11-11 DIAGNOSIS — Z791 Long term (current) use of non-steroidal anti-inflammatories (NSAID): Secondary | ICD-10-CM | POA: Diagnosis not present

## 2013-11-11 DIAGNOSIS — E669 Obesity, unspecified: Secondary | ICD-10-CM | POA: Diagnosis not present

## 2013-11-11 DIAGNOSIS — I10 Essential (primary) hypertension: Secondary | ICD-10-CM | POA: Insufficient documentation

## 2013-11-11 DIAGNOSIS — R112 Nausea with vomiting, unspecified: Secondary | ICD-10-CM | POA: Diagnosis not present

## 2013-11-11 LAB — URINALYSIS, ROUTINE W REFLEX MICROSCOPIC
BILIRUBIN URINE: NEGATIVE
GLUCOSE, UA: NEGATIVE mg/dL
HGB URINE DIPSTICK: NEGATIVE
KETONES UR: NEGATIVE mg/dL
LEUKOCYTES UA: NEGATIVE
Nitrite: NEGATIVE
PH: 5.5 (ref 5.0–8.0)
PROTEIN: NEGATIVE mg/dL
Specific Gravity, Urine: 1.02 (ref 1.005–1.030)
Urobilinogen, UA: 0.2 mg/dL (ref 0.0–1.0)

## 2013-11-11 LAB — CBG MONITORING, ED: Glucose-Capillary: 124 mg/dL — ABNORMAL HIGH (ref 70–99)

## 2013-11-11 MED ORDER — ONDANSETRON 4 MG PO TBDP
4.0000 mg | ORAL_TABLET | Freq: Once | ORAL | Status: AC
  Administered 2013-11-11: 4 mg via ORAL
  Filled 2013-11-11: qty 1

## 2013-11-11 MED ORDER — ONDANSETRON 4 MG PO TBDP: 4.0000 mg | ORAL_TABLET | Freq: Three times a day (TID) | ORAL | Status: DC | PRN

## 2013-11-11 NOTE — ED Notes (Signed)
Pt c/o n/v x 3 episodes at work this am. Denies diarrhea. nad noted.

## 2013-11-11 NOTE — Discharge Instructions (Signed)

## 2013-11-11 NOTE — ED Notes (Signed)
Po fluids offered. No vomiting since arrival.

## 2013-11-11 NOTE — ED Provider Notes (Signed)
CSN: [id number]     Arrival date & time 11/11/13  1324 History   First MD Initiated Contact with Patient 11/11/13 0700     Chief Complaint  Patient presents with  . Emesis     (Consider location/radiation/quality/duration/timing/severity/associated sxs/prior Treatment) HPI  This is a 28 year old male with a history of hypertension who presents with vomiting. Patient reports 3 episodes of nonbilious, nonbloody emesis while at work this morning. He states "I think I ate something bad." He denies any associated abdominal pain or diarrhea. No known sick contacts. Currently he is symptom free. He is unable to tolerate Gatorade. No history of diabetes or pancreatitis.  Past Medical History  Diagnosis Date  . Hypertension    Past Surgical History  Procedure Laterality Date  . Hand tendon surgery     No family history on file. History  Substance Use Topics  . Smoking status: Never Smoker   . Smokeless tobacco: Not on file  . Alcohol Use: Yes     Comment: occasional     Review of Systems  Constitutional: Negative for fever.  Respiratory: Negative for chest tightness and shortness of breath.   Cardiovascular: Negative for chest pain.  Gastrointestinal: Positive for nausea and vomiting. Negative for abdominal pain, diarrhea and constipation.  Genitourinary: Negative for dysuria.  Musculoskeletal: Negative for back pain.  All other systems reviewed and are negative.     Allergies  Bee venom  Home Medications   Prior to Admission medications   Medication Sig Start Date End Date Taking? Authorizing Provider  HYDROcodone-acetaminophen (NORCO/VICODIN) 5-325 MG per tablet Take 1 tablet by mouth every 4 (four) hours as needed for pain. 10/12/12   Lenox Ahr, PA-C  ibuprofen (ADVIL,MOTRIN) 800 MG tablet Take 1 tablet (800 mg total) by mouth 3 (three) times daily. 10/12/12   Lenox Ahr, PA-C  meloxicam (MOBIC) 7.5 MG tablet Take 1 tablet (7.5 mg total) by mouth daily. 07/22/13    Hope Bunnie Pion, NP  ondansetron (ZOFRAN-ODT) 4 MG disintegrating tablet Take 1 tablet (4 mg total) by mouth every 8 (eight) hours as needed for nausea or vomiting. 11/11/13   Merryl Hacker, MD  oxyCODONE-acetaminophen (PERCOCET) 7.5-325 MG per tablet Take 1 tablet by mouth every 4 (four) hours as needed for pain. 07/22/13   Sisters, NP  oxyCODONE-acetaminophen (PERCOCET/ROXICET) 5-325 MG per tablet Take 2 tablets by mouth every 4 (four) hours as needed for severe pain. 07/22/13   Hope Bunnie Pion, NP   BP 156/93  Pulse 96  Temp(Src) 98.8 F (37.1 C)  Resp 18  Ht 6' (1.829 m)  Wt 315 lb (142.883 kg)  BMI 42.71 kg/m2  SpO2 97% Physical Exam  Nursing note and vitals reviewed. Constitutional: He is oriented to person, place, and time. He appears well-developed and well-nourished.  Obese  HENT:  Head: Normocephalic and atraumatic.  Cardiovascular: Normal rate, regular rhythm and normal heart sounds.   Pulmonary/Chest: Effort normal. No respiratory distress.  Abdominal: Soft. Bowel sounds are normal. There is no tenderness. There is no rebound and no guarding.  Musculoskeletal: He exhibits no edema.  Neurological: He is alert and oriented to person, place, and time.  Skin: Skin is warm and dry.  Psychiatric: He has a normal mood and affect.    ED Course  Procedures (including critical care time) Labs Review Labs Reviewed  CBG MONITORING, ED - Abnormal; Notable for the following:    Glucose-Capillary 124 (*)    All other components within  normal limits  URINALYSIS, ROUTINE W REFLEX MICROSCOPIC    Imaging Review No results found.   EKG Interpretation None      MDM   Final diagnoses:  Non-intractable vomiting with nausea, vomiting of unspecified type   Patient presents with isolated emesis. Currently symptom-free. No associated symptoms. Vital signs are reassuring. Patient is afebrile. Exam is benign. Will obtain screening urinalysis to evaluate for ketones and hydration  status. We'll also obtain CBG.  Patient given Zofran ODT. Given lack of abdominal pain or associated symptoms, this time and do not feel further lab or imaging workup is warranted. CBG on 24. Urinalysis without evidence of ketones in his unremarkable. Patient has been able to tolerate fluids. We'll discharge the Zofran ODT.  After history, exam, and medical workup I feel the patient has been appropriately medically screened and is safe for discharge home. Pertinent diagnoses were discussed with the patient. Patient was given return precautions.     Merryl Hacker, MD 11/11/13 [phone]

## 2013-11-30 ENCOUNTER — Emergency Department (HOSPITAL_COMMUNITY)
Admission: EM | Admit: 2013-11-30 | Discharge: 2013-11-30 | Disposition: A | Discharge status: Home / Self Care | Payer: Medicaid Other | Attending: Emergency Medicine | Admitting: Emergency Medicine

## 2013-11-30 ENCOUNTER — Encounter (HOSPITAL_COMMUNITY): Payer: Self-pay | Admitting: Emergency Medicine

## 2013-11-30 DIAGNOSIS — S8990XA Unspecified injury of unspecified lower leg, initial encounter: Secondary | ICD-10-CM | POA: Insufficient documentation

## 2013-11-30 DIAGNOSIS — S99929A Unspecified injury of unspecified foot, initial encounter: Secondary | ICD-10-CM | POA: Diagnosis not present

## 2013-11-30 DIAGNOSIS — R209 Unspecified disturbances of skin sensation: Secondary | ICD-10-CM | POA: Insufficient documentation

## 2013-11-30 DIAGNOSIS — X58XXXA Exposure to other specified factors, initial encounter: Secondary | ICD-10-CM | POA: Diagnosis not present

## 2013-11-30 DIAGNOSIS — I10 Essential (primary) hypertension: Secondary | ICD-10-CM | POA: Insufficient documentation

## 2013-11-30 DIAGNOSIS — L819 Disorder of pigmentation, unspecified: Secondary | ICD-10-CM | POA: Insufficient documentation

## 2013-11-30 DIAGNOSIS — M25559 Pain in unspecified hip: Secondary | ICD-10-CM | POA: Insufficient documentation

## 2013-11-30 DIAGNOSIS — Y939 Activity, unspecified: Secondary | ICD-10-CM | POA: Insufficient documentation

## 2013-11-30 DIAGNOSIS — Z791 Long term (current) use of non-steroidal anti-inflammatories (NSAID): Secondary | ICD-10-CM | POA: Diagnosis not present

## 2013-11-30 DIAGNOSIS — M79605 Pain in left leg: Secondary | ICD-10-CM

## 2013-11-30 DIAGNOSIS — Y929 Unspecified place or not applicable: Secondary | ICD-10-CM | POA: Diagnosis not present

## 2013-11-30 DIAGNOSIS — S99919A Unspecified injury of unspecified ankle, initial encounter: Principal | ICD-10-CM

## 2013-11-30 MED ORDER — IBUPROFEN 800 MG PO TABS
800.0000 mg | ORAL_TABLET | Freq: Once | ORAL | Status: AC
  Administered 2013-11-30: 800 mg via ORAL
  Filled 2013-11-30: qty 1

## 2013-11-30 MED ORDER — IBUPROFEN 800 MG PO TABS: 800.0000 mg | ORAL_TABLET | Freq: Three times a day (TID) | ORAL | Status: DC

## 2013-11-30 MED ORDER — OXYCODONE-ACETAMINOPHEN 5-325 MG PO TABS
1.0000 | ORAL_TABLET | Freq: Once | ORAL | Status: AC
  Administered 2013-11-30: 1 via ORAL
  Filled 2013-11-30: qty 1

## 2013-11-30 MED ORDER — OXYCODONE-ACETAMINOPHEN 5-325 MG PO TABS: 1.0000 | ORAL_TABLET | ORAL | Status: DC | PRN

## 2013-11-30 NOTE — ED Provider Notes (Signed)
CSN: [id number]     Arrival date & time 11/30/13  1846 History   First MD Initiated Contact with Patient 11/30/13 1900     Chief Complaint  Patient presents with  . Leg Injury     (Consider location/radiation/quality/duration/timing/severity/associated sxs/prior Treatment) HPI  Anthony Wilcox is a 28 y.o. male who presents to the Emergency Department complaining of pain to the lateral left thigh for several days.  He describes the pain as aching and "feeling tight and swollen" in his leg.  He also reports a "numbness" sensation to the lateral thigh that extends to the lateral knee. Pain has continued to progress despite ibuprofen.  He denies known injury, numbness, back pain, abdominal pain, pain to his groin or testicle, redness or fever.  Pain is worse with weight bearing and mildly improves at rest.     Past Medical History  Diagnosis Date  . Hypertension    Past Surgical History  Procedure Laterality Date  . Hand tendon surgery     History reviewed. No pertinent family history. History  Substance Use Topics  . Smoking status: Never Smoker   . Smokeless tobacco: Not on file  . Alcohol Use: No     Comment: occasional     Review of Systems  Constitutional: Negative for fever and chills.  Respiratory: Negative for shortness of breath.   Cardiovascular: Negative for chest pain.  Gastrointestinal: Negative for nausea, vomiting and abdominal pain.  Genitourinary: Negative for dysuria, flank pain, scrotal swelling, difficulty urinating and testicular pain.  Musculoskeletal: Positive for myalgias. Negative for arthralgias, back pain and joint swelling.       Left thigh pain  Skin: Negative for color change and wound.  Neurological: Negative for dizziness and weakness.  All other systems reviewed and are negative.     Allergies  Bee venom  Home Medications   Prior to Admission medications   Medication Sig Start Date End Date Taking? Authorizing Provider   HYDROcodone-acetaminophen (NORCO/VICODIN) 5-325 MG per tablet Take 1 tablet by mouth every 4 (four) hours as needed for pain. 10/12/12   Lenox Ahr, PA-C  ibuprofen (ADVIL,MOTRIN) 800 MG tablet Take 1 tablet (800 mg total) by mouth 3 (three) times daily. 10/12/12   Lenox Ahr, PA-C  meloxicam (MOBIC) 7.5 MG tablet Take 1 tablet (7.5 mg total) by mouth daily. 07/22/13   Hope Bunnie Pion, NP  ondansetron (ZOFRAN-ODT) 4 MG disintegrating tablet Take 1 tablet (4 mg total) by mouth every 8 (eight) hours as needed for nausea or vomiting. 11/11/13   Merryl Hacker, MD  oxyCODONE-acetaminophen (PERCOCET) 7.5-325 MG per tablet Take 1 tablet by mouth every 4 (four) hours as needed for pain. 07/22/13   [place], NP  oxyCODONE-acetaminophen (PERCOCET/ROXICET) 5-325 MG per tablet Take 2 tablets by mouth every 4 (four) hours as needed for severe pain. 07/22/13   Hope Bunnie Pion, NP   BP 149/85  Pulse 95  Temp(Src) 98.9 F (37.2 C) (Oral)  Resp 20  Ht 6' (1.829 m)  Wt 330 lb (149.687 kg)  BMI 44.75 kg/m2  SpO2 98% Physical Exam  Nursing note and vitals reviewed. Constitutional: He is oriented to person, place, and time.  Cardiovascular: Normal rate, normal heart sounds and intact distal pulses.   No murmur heard. Pulmonary/Chest: Effort normal and breath sounds normal. No respiratory distress.  Abdominal: Soft. He exhibits no distension. There is no tenderness. There is no rebound.  Musculoskeletal: Normal range of motion. He exhibits tenderness. He  exhibits no edema.  Exam of the left LE appears nml.  Tenderness with palpation of the muscles of the thigh.  No appreciable edema, no erythema noted. Compartments are soft.  No calf pain or edema.  DP pulse brisk, distal sensation intact.  No spinal tenderness.    Neurological: He is alert and oriented to person, place, and time. He exhibits normal muscle tone. Coordination normal.  Skin: Skin is warm and dry. No rash noted. No erythema.  Several tattoos  of the left LE present, none of which are reported as new    ED Course  Procedures (including critical care time) Labs Review Labs Reviewed - No data to display  Imaging Review No results found.   EKG Interpretation None      MDM   Final diagnoses:  Leg pain, lateral, left    Patient is well appearing. Nontoxic appearing. Normal exam of the left lower extremity. Patient does report a swelling and localized numbness to the lateral left thigh. Distal sensation is intact .  No rashes or lesions of the skin, DP pulses are brisk and equal bilaterally, compartments of the left lower extremity are soft. Clinical suspicion for DVT is low, symptoms are likely related to myalgias although I have arranged for the patient to return tomorrow morning at 8 AM and 4 venous ultrasound. Patient agrees to care plan and verbalized his understanding. Clinical suspicion for DVT is low so therefore Lovenox is not given. rx for percocet and ibuprofen .  Pt agrees to arrange f/u with his PMD on Monday    Santrice Muzio L. Vanessa [place], PA-C 12/02/13 1630

## 2013-11-30 NOTE — Discharge Instructions (Signed)

## 2013-11-30 NOTE — ED Notes (Signed)
Pt reports left thigh pain for several days. Pt denies any known injury. nad noted.

## 2013-12-01 ENCOUNTER — Ambulatory Visit (HOSPITAL_COMMUNITY)
Admit: 2013-12-01 | Discharge: 2013-12-01 | Disposition: A | Discharge status: Home / Self Care | Payer: Medicaid Other | Source: Skilled Nursing Facility | Attending: Emergency Medicine | Admitting: Emergency Medicine

## 2013-12-01 DIAGNOSIS — M79609 Pain in unspecified limb: Secondary | ICD-10-CM | POA: Diagnosis not present

## 2013-12-01 DIAGNOSIS — M7989 Other specified soft tissue disorders: Secondary | ICD-10-CM | POA: Diagnosis not present

## 2013-12-01 MED ORDER — CYCLOBENZAPRINE HCL 10 MG PO TABS
10.0000 mg | ORAL_TABLET | Freq: Three times a day (TID) | ORAL | Status: DC | PRN
Start: 2013-12-01 — End: 2014-01-15

## 2013-12-01 NOTE — ED Provider Notes (Signed)
Pt returning for [place] to eval for DVT after ED evaluation yesterday. Report reviewed. Neg for SVT/DVT. Discussed with pt.   Virgel Manifold, MD 12/01/13 1007

## 2013-12-03 NOTE — ED Provider Notes (Signed)
Medical screening examination/treatment/procedure(s) were performed by non-physician practitioner and as supervising physician I was immediately available for consultation/collaboration.   EKG Interpretation None        Maudry Diego, MD 12/03/13 1357

## 2013-12-23 ENCOUNTER — Other Ambulatory Visit (HOSPITAL_COMMUNITY): Payer: Self-pay | Admitting: "Endocrinology

## 2013-12-25 ENCOUNTER — Other Ambulatory Visit (HOSPITAL_COMMUNITY): Payer: Self-pay | Admitting: "Endocrinology

## 2013-12-25 DIAGNOSIS — R900 Intracranial space-occupying lesion found on diagnostic imaging of central nervous system: Secondary | ICD-10-CM

## 2014-01-02 ENCOUNTER — Other Ambulatory Visit: Payer: Medicaid Other

## 2014-01-03 ENCOUNTER — Other Ambulatory Visit: Payer: Medicaid Other

## 2014-01-05 ENCOUNTER — Other Ambulatory Visit: Payer: Medicaid Other

## 2014-01-12 ENCOUNTER — Ambulatory Visit
Admission: RE | Admit: 2014-01-12 | Discharge: 2014-01-12 | Disposition: A | Discharge status: Home / Self Care | Payer: Medicaid Other | Source: Ambulatory Visit | Attending: "Endocrinology | Admitting: "Endocrinology

## 2014-01-12 ENCOUNTER — Other Ambulatory Visit: Payer: Medicaid Other

## 2014-01-12 DIAGNOSIS — R900 Intracranial space-occupying lesion found on diagnostic imaging of central nervous system: Secondary | ICD-10-CM

## 2014-01-12 MED ORDER — GADOBENATE DIMEGLUMINE 529 MG/ML IV SOLN
10.0000 mL | Freq: Once | INTRAVENOUS | Status: AC | PRN
  Administered 2014-01-12: 10 mL via INTRAVENOUS

## 2014-01-15 ENCOUNTER — Encounter (HOSPITAL_COMMUNITY): Payer: Self-pay | Admitting: Pharmacy Technician

## 2014-01-23 NOTE — Patient Instructions (Signed)
Anthony Wilcox  01/23/2014   Your procedure is scheduled on:   01/29/2014  Report to [facility] at  37  AM.  Call this number if you have problems the morning of surgery: [phone]   Remember:   Do not eat food or drink liquids after midnight.   Take these medicines the morning of surgery with A SIP OF WATER: none   Do not wear jewelry, make-up or nail polish.  Do not wear lotions, powders, or perfumes.   Do not shave 48 hours prior to surgery. Men may shave face and neck.  Do not bring valuables to the hospital.  [facility] is not responsible for any belongings or valuables.               Contacts, dentures or bridgework may not be worn into surgery.  Leave suitcase in the car. After surgery it may be brought to your room.  For patients admitted to the hospital, discharge time is determined by your treatment team.               Patients discharged the day of surgery will not be allowed to drive home.  Name and phone number of your driver: family  Special Instructions: Shower using CHG 2 nights before surgery and the night before surgery.  If you shower the day of surgery use CHG.  Use special wash - you have one bottle of CHG for all showers.  You should use approximately 1/3 of the bottle for each shower.   Please read over the following fact sheets that you were given: Pain Booklet, Coughing and Deep Breathing, Surgical Site Infection Prevention, Anesthesia Post-op Instructions and Care and Recovery After Surgery Hernia A hernia occurs when an internal organ pushes out through a weak spot in the abdominal wall. Hernias most commonly occur in the groin and around the navel. Hernias often can be pushed back into place (reduced). Most hernias tend to get worse over time. Some abdominal hernias can get stuck in the opening (irreducible or incarcerated hernia) and cannot be reduced. An irreducible abdominal hernia which is tightly squeezed into the opening is at risk for  impaired blood supply (strangulated hernia). A strangulated hernia is a medical emergency. Because of the risk for an irreducible or strangulated hernia, surgery may be recommended to repair a hernia. CAUSES   Heavy lifting.  Prolonged coughing.  Straining to have a bowel movement.  A cut (incision) made during an abdominal surgery. HOME CARE INSTRUCTIONS   Bed rest is not required. You may continue your normal activities.  Avoid lifting more than 10 pounds (4.5 kg) or straining.  Cough gently. If you are a smoker it is best to stop. Even the best hernia repair can break down with the continual strain of coughing. Even if you do not have your hernia repaired, a cough will continue to aggravate the problem.  Do not wear anything tight over your hernia. Do not try to keep it in with an outside bandage or truss. These can damage abdominal contents if they are trapped within the hernia sac.  Eat a normal diet.  Avoid constipation. Straining over long periods of time will increase hernia size and encourage breakdown of repairs. If you cannot do this with diet alone, stool softeners may be used. SEEK IMMEDIATE MEDICAL CARE IF:   You have a fever.  You develop increasing abdominal pain.  You feel nauseous or vomit.  Your hernia is stuck  outside the abdomen, looks discolored, feels hard, or is tender.  You have any changes in your bowel habits or in the hernia that are unusual for you.  You have increased pain or swelling around the hernia.  You cannot push the hernia back in place by applying gentle pressure while lying down. MAKE SURE YOU:   Understand these instructions.  Will watch your condition.  Will get help right away if you are not doing well or get worse. Document Released: 03/07/2005 Document Revised: 05/30/2011 Document Reviewed: 10/25/2007 [facility] Patient Information 2015 [place], [place]. This information is not intended to replace advice given to you by your  health care provider. Make sure you discuss any questions you have with your health care provider. PATIENT INSTRUCTIONS POST-ANESTHESIA  IMMEDIATELY FOLLOWING SURGERY:  Do not drive or operate machinery for the first twenty four hours after surgery.  Do not make any important decisions for twenty four hours after surgery or while taking narcotic pain medications or sedatives.  If you develop intractable nausea and vomiting or a severe headache please notify your doctor immediately.  FOLLOW-UP:  Please make an appointment with your surgeon as instructed. You do not need to follow up with anesthesia unless specifically instructed to do so.  WOUND CARE INSTRUCTIONS (if applicable):  Keep a dry clean dressing on the anesthesia/puncture wound site if there is drainage.  Once the wound has quit draining you may leave it open to air.  Generally you should leave the bandage intact for twenty four hours unless there is drainage.  If the epidural site drains for more than 36-48 hours please call the anesthesia department.  QUESTIONS?:  Please feel free to call your physician or the hospital operator if you have any questions, and they will be happy to assist you.

## 2014-01-23 NOTE — H&P (Signed)
  NTS SOAP Note  Vital Signs:  Vitals as of: 46/28/6381: Systolic 771: Diastolic 96: Heart Rate 90: Temp 98.75F: Height 38ft 0in: Weight 361Lbs 0 Ounces: BMI 48.96  BMI : 48.96 kg/m2  Subjective: This 28 year old male presents for of a right inguinal hernia.  Recently found on physical exam.  Is causing him discomfort.  Made worse with straining.  Review of Symptoms:  Constitutional:unremarkable   Head:unremarkable Eyes:unremarkable   Nose/Mouth/Throat:unremarkable Cardiovascular:  unremarkable Respiratory:unremarkable Gastrointestinal:  unremarkable   Genitourinary:unremarkable   Musculoskeletal:unremarkable Skin:unremarkable Hematolgic/Lymphatic:unremarkable   Allergic/Immunologic:unremarkable   Past Medical History:  Reviewed  Past Medical History  Surgical History: right hand surgery Medical Problems: none Allergies: nkda Medications: none   Social History:Reviewed  Social History  Preferred Language: English Race:  White Ethnicity: Not Hispanic / Latino Age: 52 year Marital Status:  S Alcohol: no   Smoking Status: Never smoker reviewed on 01/02/2014 Functional Status reviewed on 01/02/2014 ------------------------------------------------ Bathing: Normal Cooking: Normal Dressing: Normal Driving: Normal Eating: Normal Managing Meds: Normal Oral Care: Normal Shopping: Normal Toileting: Normal Transferring: Normal Walking: Normal Cognitive Status reviewed on 01/02/2014 ------------------------------------------------ Attention: Normal Decision Making: Normal Language: Normal Memory: Normal Motor: Normal Perception: Normal Problem Solving: Normal Visual and Spatial: Normal   Family History:Reviewed  Family Health History Mother, Living; Healthy;  Father, Living; Healthy;     Objective Information: General:Well appearing, well nourished in no distress. Heart:RRR, no murmur or gallop.  Normal S1, S2.  No S3, S4.   Lungs:  CTA bilaterally, no wheezes, rhonchi, rales.  Breathing unlabored. Abdomen:Soft, NT/ND, no HSM, no masses.  Reducible right inguinal hernia. small testicles bilaterally.  Assessment:Right inguinal hernia  Diagnoses: 550.90  K40.90 Inguinal hernia (Unilateral inguinal hernia, without obstruction or gangrene, not specified as recurrent)  Procedures: 16579 - OFFICE OUTPATIENT NEW 30 MINUTES    Plan:  Scheduled for right inguinal herniorrhaphy with mesh on 01/29/14.   Patient Education:Alternative treatments to surgery were discussed with patient (and family).  Risks and benefits  of procedure including bleeding,  infection,  mesh use,  and recurrence of the hernia were fully explained to the patient (and family) who gave informed consent. Patient/family questions were addressed.  Follow-up:Pending Surgery

## 2014-01-24 ENCOUNTER — Encounter (HOSPITAL_COMMUNITY): Payer: Self-pay

## 2014-01-24 ENCOUNTER — Encounter (HOSPITAL_COMMUNITY)
Admission: RE | Admit: 2014-01-24 | Discharge: 2014-01-24 | Disposition: A | Discharge status: Home / Self Care | Payer: Medicaid Other | Source: Ambulatory Visit | Attending: General Surgery | Admitting: General Surgery

## 2014-01-24 ENCOUNTER — Other Ambulatory Visit: Payer: Self-pay

## 2014-01-24 DIAGNOSIS — I498 Other specified cardiac arrhythmias: Secondary | ICD-10-CM | POA: Insufficient documentation

## 2014-01-24 DIAGNOSIS — Z01818 Encounter for other preprocedural examination: Secondary | ICD-10-CM | POA: Insufficient documentation

## 2014-01-24 HISTORY — DX: Neoplasm of unspecified behavior of endocrine glands and other parts of nervous system: D49.7

## 2014-01-24 LAB — CBC WITH DIFFERENTIAL/PLATELET
Basophils Absolute: 0 10*3/uL (ref 0.0–0.1)
Basophils Relative: 0 % (ref 0–1)
EOS ABS: 0.2 10*3/uL (ref 0.0–0.7)
Eosinophils Relative: 2 % (ref 0–5)
HEMATOCRIT: 41.4 % (ref 39.0–52.0)
HEMOGLOBIN: 13.9 g/dL (ref 13.0–17.0)
LYMPHS ABS: 2.9 10*3/uL (ref 0.7–4.0)
Lymphocytes Relative: 25 % (ref 12–46)
MCH: 28.1 pg (ref 26.0–34.0)
MCHC: 33.6 g/dL (ref 30.0–36.0)
MCV: 83.8 fL (ref 78.0–100.0)
MONO ABS: 0.8 10*3/uL (ref 0.1–1.0)
MONOS PCT: 7 % (ref 3–12)
NEUTROS ABS: 7.8 10*3/uL — AB (ref 1.7–7.7)
NEUTROS PCT: 66 % (ref 43–77)
Platelets: 317 10*3/uL (ref 150–400)
RBC: 4.94 MIL/uL (ref 4.22–5.81)
RDW: 14.3 % (ref 11.5–15.5)
WBC: 11.8 10*3/uL — ABNORMAL HIGH (ref 4.0–10.5)

## 2014-01-24 LAB — BASIC METABOLIC PANEL
Anion gap: 14 (ref 5–15)
BUN: 15 mg/dL (ref 6–23)
CHLORIDE: 102 meq/L (ref 96–112)
CO2: 26 meq/L (ref 19–32)
Calcium: 9.4 mg/dL (ref 8.4–10.5)
Creatinine, Ser: 1.02 mg/dL (ref 0.50–1.35)
GFR calc non Af Amer: >90 mL/min (ref >90)
Glucose, Bld: 102 mg/dL — ABNORMAL HIGH (ref 70–99)
POTASSIUM: 4.5 meq/L (ref 3.7–5.3)
Sodium: 142 mEq/L (ref 137–147)

## 2014-01-24 NOTE — Pre-Procedure Instructions (Signed)
Patient given information to sign up for my chart at home. Dr Arnoldo Morale and Dr Patsey Berthold aware of recent DX of pituitary tumor, ok to proceed with surgery.

## 2014-01-24 NOTE — Progress Notes (Signed)
   01/24/14 0830  OBSTRUCTIVE SLEEP APNEA  Have you ever been diagnosed with sleep apnea through a sleep study? No  Do you snore loudly (loud enough to be heard through closed doors)?  0  Do you often feel tired, fatigued, or sleepy during the daytime? 0  Has anyone observed you stop breathing during your sleep? 1  Do you have, or are you being treated for high blood pressure? 0  BMI more than 35 kg/m2? 1  Age over 26 years old? 0  Neck circumference greater than 40 cm/16 inches? 1  Gender: 1  Obstructive Sleep Apnea Score 4  Score 4 or greater  Results sent to PCP

## 2014-01-29 ENCOUNTER — Encounter (HOSPITAL_COMMUNITY)
Admission: RE | Disposition: A | Discharge status: Home / Self Care | Payer: Self-pay | Source: Ambulatory Visit | Attending: General Surgery

## 2014-01-29 ENCOUNTER — Ambulatory Visit (HOSPITAL_COMMUNITY): Payer: Medicaid Other | Admitting: Anesthesiology

## 2014-01-29 ENCOUNTER — Ambulatory Visit (HOSPITAL_COMMUNITY)
Admission: RE | Admit: 2014-01-29 | Discharge: 2014-01-29 | Disposition: A | Discharge status: Home / Self Care | Payer: Medicaid Other | Source: Ambulatory Visit | Attending: General Surgery | Admitting: General Surgery

## 2014-01-29 ENCOUNTER — Encounter (HOSPITAL_COMMUNITY): Payer: Self-pay | Admitting: *Deleted

## 2014-01-29 DIAGNOSIS — K4090 Unilateral inguinal hernia, without obstruction or gangrene, not specified as recurrent: Secondary | ICD-10-CM | POA: Insufficient documentation

## 2014-01-29 DIAGNOSIS — I10 Essential (primary) hypertension: Secondary | ICD-10-CM | POA: Diagnosis not present

## 2014-01-29 DIAGNOSIS — Z79899 Other long term (current) drug therapy: Secondary | ICD-10-CM | POA: Insufficient documentation

## 2014-01-29 HISTORY — PX: INGUINAL HERNIA REPAIR: SHX194

## 2014-01-29 HISTORY — PX: INSERTION OF MESH: SHX5868

## 2014-01-29 SURGERY — REPAIR, HERNIA, INGUINAL, ADULT
Anesthesia: General | Site: Groin | Laterality: Right

## 2014-01-29 MED ORDER — BUPIVACAINE LIPOSOME 1.3 % IJ SUSP
INTRAMUSCULAR | Status: DC | PRN
  Administered 2014-01-29: 11 mL

## 2014-01-29 MED ORDER — FENTANYL CITRATE 0.05 MG/ML IJ SOLN
INTRAMUSCULAR | Status: AC
  Filled 2014-01-29: qty 5

## 2014-01-29 MED ORDER — KETOROLAC TROMETHAMINE 30 MG/ML IJ SOLN
INTRAMUSCULAR | Status: AC
  Filled 2014-01-29: qty 1

## 2014-01-29 MED ORDER — CHLORHEXIDINE GLUCONATE 4 % EX LIQD: 1.0000 "application " | Freq: Once | CUTANEOUS | Status: DC

## 2014-01-29 MED ORDER — LIDOCAINE HCL (CARDIAC) 10 MG/ML IV SOLN
INTRAVENOUS | Status: DC | PRN
  Administered 2014-01-29: 50 mg via INTRAVENOUS

## 2014-01-29 MED ORDER — GLYCOPYRROLATE 0.2 MG/ML IJ SOLN
INTRAMUSCULAR | Status: AC
  Filled 2014-01-29: qty 1

## 2014-01-29 MED ORDER — FENTANYL CITRATE 0.05 MG/ML IJ SOLN
INTRAMUSCULAR | Status: DC | PRN
  Administered 2014-01-29 (×3): 25 ug via INTRAVENOUS
  Administered 2014-01-29: 50 ug via INTRAVENOUS
  Administered 2014-01-29 (×3): 25 ug via INTRAVENOUS
  Administered 2014-01-29: 50 ug via INTRAVENOUS
  Administered 2014-01-29: 25 ug via INTRAVENOUS
  Administered 2014-01-29: 50 ug via INTRAVENOUS
  Administered 2014-01-29: 25 ug via INTRAVENOUS

## 2014-01-29 MED ORDER — PROPOFOL 10 MG/ML IV BOLUS
INTRAVENOUS | Status: DC | PRN
  Administered 2014-01-29: 180 mg via INTRAVENOUS

## 2014-01-29 MED ORDER — CEFAZOLIN SODIUM-DEXTROSE 2-3 GM-% IV SOLR
2.0000 g | INTRAVENOUS | Status: AC
  Administered 2014-01-29: 2 g via INTRAVENOUS
  Filled 2014-01-29: qty 50

## 2014-01-29 MED ORDER — OXYCODONE-ACETAMINOPHEN 7.5-325 MG PO TABS: 1.0000 | ORAL_TABLET | ORAL | Status: AC | PRN

## 2014-01-29 MED ORDER — FENTANYL CITRATE 0.05 MG/ML IJ SOLN
INTRAMUSCULAR | Status: AC
  Filled 2014-01-29: qty 2

## 2014-01-29 MED ORDER — SODIUM CHLORIDE 0.9 % IR SOLN
Status: DC | PRN
  Administered 2014-01-29: 1000 mL

## 2014-01-29 MED ORDER — BUPIVACAINE LIPOSOME 1.3 % IJ SUSP
INTRAMUSCULAR | Status: AC
  Filled 2014-01-29: qty 20

## 2014-01-29 MED ORDER — CEFAZOLIN SODIUM 1-5 GM-% IV SOLN
1.0000 g | INTRAVENOUS | Status: AC
  Administered 2014-01-29: 1 g via INTRAVENOUS
  Filled 2014-01-29: qty 50

## 2014-01-29 MED ORDER — GLYCOPYRROLATE 0.2 MG/ML IJ SOLN
0.2000 mg | Freq: Once | INTRAMUSCULAR | Status: AC
  Administered 2014-01-29: 0.2 mg via INTRAVENOUS

## 2014-01-29 MED ORDER — ONDANSETRON HCL 4 MG/2ML IJ SOLN
INTRAMUSCULAR | Status: AC
  Filled 2014-01-29: qty 2

## 2014-01-29 MED ORDER — KETOROLAC TROMETHAMINE 30 MG/ML IJ SOLN
30.0000 mg | Freq: Once | INTRAMUSCULAR | Status: AC
  Administered 2014-01-29: 30 mg via INTRAVENOUS

## 2014-01-29 MED ORDER — LACTATED RINGERS IV SOLN
INTRAVENOUS | Status: DC
  Administered 2014-01-29: 1000 mL via INTRAVENOUS

## 2014-01-29 MED ORDER — ONDANSETRON HCL 4 MG/2ML IJ SOLN
4.0000 mg | Freq: Once | INTRAMUSCULAR | Status: AC
  Administered 2014-01-29: 4 mg via INTRAVENOUS

## 2014-01-29 MED ORDER — MIDAZOLAM HCL 2 MG/2ML IJ SOLN
INTRAMUSCULAR | Status: AC
  Filled 2014-01-29: qty 2

## 2014-01-29 MED ORDER — LIDOCAINE HCL (PF) 1 % IJ SOLN
INTRAMUSCULAR | Status: AC
  Filled 2014-01-29: qty 5

## 2014-01-29 MED ORDER — MIDAZOLAM HCL 2 MG/2ML IJ SOLN
1.0000 mg | INTRAMUSCULAR | Status: DC | PRN
  Administered 2014-01-29 (×2): 2 mg via INTRAVENOUS
  Filled 2014-01-29: qty 2

## 2014-01-29 MED ORDER — FENTANYL CITRATE 0.05 MG/ML IJ SOLN
25.0000 ug | INTRAMUSCULAR | Status: DC | PRN
  Administered 2014-01-29 (×4): 50 ug via INTRAVENOUS

## 2014-01-29 MED ORDER — EPHEDRINE SULFATE 50 MG/ML IJ SOLN
INTRAMUSCULAR | Status: AC
  Filled 2014-01-29: qty 1

## 2014-01-29 MED ORDER — PROPOFOL 10 MG/ML IV BOLUS
INTRAVENOUS | Status: AC
  Filled 2014-01-29: qty 20

## 2014-01-29 MED ORDER — LACTATED RINGERS IV SOLN
INTRAVENOUS | Status: DC | PRN
  Administered 2014-01-29: 09:00:00 via INTRAVENOUS

## 2014-01-29 MED ORDER — ONDANSETRON HCL 4 MG/2ML IJ SOLN: 4.0000 mg | Freq: Once | INTRAMUSCULAR | Status: DC | PRN

## 2014-01-29 MED ORDER — DEXTROSE 5 % IV SOLN: 3.0000 g | INTRAVENOUS | Status: DC

## 2014-01-29 MED ORDER — ARTIFICIAL TEARS OP OINT
TOPICAL_OINTMENT | OPHTHALMIC | Status: AC
  Filled 2014-01-29: qty 3.5

## 2014-01-29 MED ORDER — SODIUM CHLORIDE 0.9 % IJ SOLN
INTRAMUSCULAR | Status: AC
  Filled 2014-01-29: qty 10

## 2014-01-29 MED ORDER — FENTANYL CITRATE 0.05 MG/ML IJ SOLN
25.0000 ug | INTRAMUSCULAR | Status: AC
  Administered 2014-01-29 (×2): 25 ug via INTRAVENOUS

## 2014-01-29 SURGICAL SUPPLY — 40 items
BAG HAMPER (MISCELLANEOUS) ×3 IMPLANT
BLADE SURG 15 STRL LF DISP TIS (BLADE) ×1 IMPLANT
BLADE SURG 15 STRL SS (BLADE) ×2
CLOTH BEACON ORANGE TIMEOUT ST (SAFETY) ×3 IMPLANT
COVER LIGHT HANDLE STERIS (MISCELLANEOUS) ×6 IMPLANT
DECANTER SPIKE VIAL GLASS SM (MISCELLANEOUS) ×3 IMPLANT
DERMABOND ADVANCED (GAUZE/BANDAGES/DRESSINGS) ×2
DERMABOND ADVANCED .7 DNX12 (GAUZE/BANDAGES/DRESSINGS) ×1 IMPLANT
DRAIN PENROSE 18X1/2 LTX STRL (DRAIN) ×3 IMPLANT
ELECT REM PT RETURN 9FT ADLT (ELECTROSURGICAL) ×3
ELECTRODE REM PT RTRN 9FT ADLT (ELECTROSURGICAL) ×1 IMPLANT
GLOVE BIO SURGEON STRL SZ7 (GLOVE) ×3 IMPLANT
GLOVE BIOGEL PI IND STRL 7.0 (GLOVE) ×1 IMPLANT
GLOVE BIOGEL PI IND STRL 7.5 (GLOVE) ×2 IMPLANT
GLOVE BIOGEL PI INDICATOR 7.0 (GLOVE) ×2
GLOVE BIOGEL PI INDICATOR 7.5 (GLOVE) ×4
GLOVE EXAM NITRILE LRG STRL (GLOVE) ×3 IMPLANT
GLOVE SURG SS PI 7.5 STRL IVOR (GLOVE) ×6 IMPLANT
GOWN STRL REUS W/ TWL XL LVL3 (GOWN DISPOSABLE) ×1 IMPLANT
GOWN STRL REUS W/TWL LRG LVL3 (GOWN DISPOSABLE) ×6 IMPLANT
GOWN STRL REUS W/TWL XL LVL3 (GOWN DISPOSABLE) ×2
INST SET MINOR GENERAL (KITS) ×3 IMPLANT
KIT ROOM TURNOVER APOR (KITS) ×3 IMPLANT
MANIFOLD NEPTUNE II (INSTRUMENTS) ×3 IMPLANT
MESH MARLEX PLUG MEDIUM (Mesh General) ×3 IMPLANT
NEEDLE HYPO 21X1.5 SAFETY (NEEDLE) ×3 IMPLANT
NS IRRIG 1000ML POUR BTL (IV SOLUTION) ×3 IMPLANT
PACK MINOR (CUSTOM PROCEDURE TRAY) ×3 IMPLANT
PAD ARMBOARD 7.5X6 YLW CONV (MISCELLANEOUS) ×3 IMPLANT
SCRUB PCMX 4 OZ (MISCELLANEOUS) ×3 IMPLANT
SET BASIN LINEN APH (SET/KITS/TRAYS/PACK) ×3 IMPLANT
SUT NOVA NAB GS-22 2 2-0 T-19 (SUTURE) ×9 IMPLANT
SUT SILK 3 0 (SUTURE) ×2
SUT SILK 3-0 18XBRD TIE 12 (SUTURE) ×1 IMPLANT
SUT VIC AB 2-0 CT1 27 (SUTURE) ×2
SUT VIC AB 2-0 CT1 TAPERPNT 27 (SUTURE) ×1 IMPLANT
SUT VIC AB 3-0 SH 27 (SUTURE) ×2
SUT VIC AB 3-0 SH 27X BRD (SUTURE) ×1 IMPLANT
SUT VIC AB 4-0 PS2 27 (SUTURE) ×3 IMPLANT
SYR 20CC LL (SYRINGE) ×3 IMPLANT

## 2014-01-29 NOTE — Anesthesia Postprocedure Evaluation (Signed)
  Anesthesia Post-op Note  Patient: Anthony Wilcox  Procedure(s) Performed: Procedure(s): HERNIA REPAIR INGUINAL ADULT (Right) INSERTION OF MESH (Right)  Patient Location: PACU  Anesthesia Type:General  Level of Consciousness: awake, alert , oriented and patient cooperative  Airway and Oxygen Therapy: Patient Spontanous Breathing and Patient connected to face mask oxygen  Post-op Pain: none  Post-op Assessment: Post-op Vital signs reviewed, Patient's Cardiovascular Status Stable, Respiratory Function Stable, Patent Airway, No signs of Nausea or vomiting and Pain level controlled  Post-op Vital Signs: Reviewed and stable  Last Vitals:  Filed Vitals:   01/29/14 1130  BP: 135/84  Pulse: 98  Temp:   Resp: 24    Complications: No apparent anesthesia complications

## 2014-01-29 NOTE — Op Note (Signed)
Patient:  Anthony Wilcox  DOB:  [birth year]  MRN:  [id number]   Preop Diagnosis:  Right inguinal hernia  Postop Diagnosis:  same  Procedure:  Right inguinal herniorrhaphy with mesh  Surgeon:  Aviva Signs, M.D.  Anes:  general  Indications:  Patient is a 28 year old white male who presents with a right inguinal hernia. The risks and benefits of the procedure including bleeding, infection, mesh use, possibly recurrence of the hernia were fully explained to the patient, who gave informed consent.  Procedure note:  The patient was placed the supine position. After general anesthesia was administered, the right groin region was prepped and draped using usual sterile technique with Betadine. Surgical site confirmation was performed.  An incision was made in the right groin region down to the external oblique aponeuroses. The aponeurosis was incised to the external ring. A Penrose drain was placed around the spermatic cord. The vas deferens was noted within the spermatic cord. The ilioinguinal nerve was identified retracted superiorly from the operative field. An indirect hernia sac was found with a lipoma attached to it. This was inverted without difficulty. A medium size Bard PerFix plug was then inserted. An onlay patch was then placed along the floor of the inguinal canal and secured superiorly to the conjoined tendon and inferiorly to the shelving edge of Poupart's ligament using 2-0 Novafil interrupted sutures.the internal ring was re-created using a 2-0 Novafil interrupted suture. The external oblique aponeuroses was reapproximated using a 2-0 Vicryl running suture. The subcutaneous layer was reapproximated using 3-0 Vicryl interrupted sutures. The skin was closed using a 4-0 Vicryl subcuticular suture. Exparel was instilled the surrounding wound. Dermabond was then applied. The patient's right testicle was positioned back in the scrotal sac in its normal preoperative elevated  state.  All tape and needle counts were correct at the end of the procedure. Patient was awakened and transferred to PACU in stable condition.  Complications:  none  EBL:  minimal  Specimen:  none

## 2014-01-29 NOTE — Anesthesia Preprocedure Evaluation (Signed)
Anesthesia Evaluation  Patient identified by MRN, date of birth, ID band Patient awake    Reviewed: Allergy & Precautions, H&P , NPO status , Patient's Chart, lab work & pertinent test results  Airway Mallampati: II  TM Distance: >3 FB     Dental  (+) Teeth Intact   Pulmonary  breath sounds clear to auscultation        Cardiovascular hypertension, Pt. on medications Rhythm:Regular Rate:Normal     Neuro/Psych  Headaches, Pituitary adenoma - medical Rx    GI/Hepatic negative GI ROS,   Endo/Other    Renal/GU      Musculoskeletal   Abdominal   Peds  Hematology   Anesthesia Other Findings   Reproductive/Obstetrics                             Anesthesia Physical Anesthesia Plan  ASA: II  Anesthesia Plan: General   Post-op Pain Management:    Induction: Intravenous  Airway Management Planned: LMA  Additional Equipment:   Intra-op Plan:   Post-operative Plan: Extubation in OR  Informed Consent: I have reviewed the patients History and Physical, chart, labs and discussed the procedure including the risks, benefits and alternatives for the proposed anesthesia with the patient or authorized representative who has indicated his/her understanding and acceptance.     Plan Discussed with:   Anesthesia Plan Comments:         Anesthesia Quick Evaluation

## 2014-01-29 NOTE — Interval H&P Note (Signed)
History and Physical Interval Note:  01/29/2014 9:23 AM  Anthony Wilcox  has presented today for surgery, with the diagnosis of right inguinal hernia  The various methods of treatment have been discussed with the patient and family. After consideration of risks, benefits and other options for treatment, the patient has consented to  Procedure(s): HERNIA REPAIR INGUINAL ADULT (Right) as a surgical intervention .  The patient's history has been reviewed, patient examined, no change in status, stable for surgery.  I have reviewed the patient's chart and labs.  Questions were answered to the patient's satisfaction.     Aviva Signs A

## 2014-01-29 NOTE — Transfer of Care (Signed)
Immediate Anesthesia Transfer of Care Note  Patient: Anthony Wilcox  Procedure(s) Performed: Procedure(s): HERNIA REPAIR INGUINAL ADULT (Right) INSERTION OF MESH (Right)  Patient Location: PACU  Anesthesia Type:General  Level of Consciousness: awake, alert , oriented and patient cooperative  Airway & Oxygen Therapy: Patient Spontanous Breathing and Patient connected to face mask oxygen  Post-op Assessment: Report given to PACU RN and Post -op Vital signs reviewed and stable  Post vital signs: Reviewed and stable  Complications: No apparent anesthesia complications

## 2014-01-29 NOTE — Anesthesia Procedure Notes (Signed)
Procedure Name: LMA Insertion Date/Time: 01/29/2014 10:20 AM Performed by: Andree Elk, AMY A Pre-anesthesia Checklist: Patient identified, Timeout performed, Emergency Drugs available, Suction available and Patient being monitored Patient Re-evaluated:Patient Re-evaluated prior to inductionOxygen Delivery Method: Circle system utilized Preoxygenation: Pre-oxygenation with 100% oxygen Intubation Type: IV induction Ventilation: Two handed mask ventilation required and Oral airway inserted - appropriate to patient size LMA: LMA inserted LMA Size: 5.0 Number of attempts: 1 Placement Confirmation: positive ETCO2 and breath sounds checked- equal and bilateral Tube secured with: Tape Dental Injury: Teeth and Oropharynx as per pre-operative assessment

## 2014-01-29 NOTE — Discharge Instructions (Signed)
Inguinal Hernia, Adult  °Care After °Refer to this sheet in the next few weeks. These discharge instructions provide you with general information on caring for yourself after you leave the hospital. Your caregiver may also give you specific instructions. Your treatment has been planned according to the most current medical practices available, but unavoidable complications sometimes occur. If you have any problems or questions after discharge, please call your caregiver. °HOME CARE INSTRUCTIONS °· Put ice on the operative site. °¨ Put ice in a plastic bag. °¨ Place a towel between your skin and the bag. °¨ Leave the ice on for 15-20 minutes at a time, 03-04 times a day while awake. °· Change bandages (dressings) as directed. °· Keep the wound dry and clean. The wound may be washed gently with soap and water. Gently blot or dab the wound dry. It is okay to take showers 24 to 48 hours after surgery. Do not take baths, use swimming pools, or use hot tubs for 10 days, or as directed by your caregiver. °· Only take over-the-counter or prescription medicines for pain, discomfort, or fever as directed by your caregiver. °· Continue your normal diet as directed. °· Do not lift anything more than 10 pounds or play contact sports for 3 weeks, or as directed. °SEEK MEDICAL CARE IF: °· There is redness, swelling, or increasing pain in the wound. °· There is fluid (pus) coming from the wound. °· There is drainage from a wound lasting longer than 1 day. °· You have an oral temperature above 102° F (38.9° C). °· You notice a bad smell coming from the wound or dressing. °· The wound breaks open after the stitches (sutures) have been removed. °· You notice increasing pain in the shoulders (shoulder strap areas). °· You develop dizzy episodes or fainting while standing. °· You feel sick to your stomach (nauseous) or throw up (vomit). °SEEK IMMEDIATE MEDICAL CARE IF: °· You develop a rash. °· You have difficulty breathing. °· You  develop a reaction or have side effects to medicines you were given. °MAKE SURE YOU:  °· Understand these instructions. °· Will watch your condition. °· Will get help right away if you are not doing well or get worse. °Document Released: 04/07/2006 Document Revised: 05/30/2011 Document Reviewed: 02/04/2009 °ExitCare® Patient Information ©2015 [employer]. This information is not intended to replace advice given to you by your health care provider. Make sure you discuss any questions you have with your health care provider. ° °

## 2014-01-30 ENCOUNTER — Encounter (HOSPITAL_COMMUNITY): Payer: Self-pay | Admitting: General Surgery

## 2014-03-10 ENCOUNTER — Encounter (HOSPITAL_COMMUNITY): Payer: Self-pay | Admitting: Emergency Medicine

## 2014-03-10 ENCOUNTER — Emergency Department (HOSPITAL_COMMUNITY)
Admission: EM | Admit: 2014-03-10 | Discharge: 2014-03-10 | Disposition: A | Discharge status: Home / Self Care | Payer: Medicaid Other | Attending: Emergency Medicine | Admitting: Emergency Medicine

## 2014-03-10 DIAGNOSIS — Z79899 Other long term (current) drug therapy: Secondary | ICD-10-CM | POA: Diagnosis not present

## 2014-03-10 DIAGNOSIS — I10 Essential (primary) hypertension: Secondary | ICD-10-CM | POA: Diagnosis not present

## 2014-03-10 DIAGNOSIS — K529 Noninfective gastroenteritis and colitis, unspecified: Secondary | ICD-10-CM | POA: Diagnosis not present

## 2014-03-10 DIAGNOSIS — Z8639 Personal history of other endocrine, nutritional and metabolic disease: Secondary | ICD-10-CM | POA: Insufficient documentation

## 2014-03-10 DIAGNOSIS — R51 Headache: Secondary | ICD-10-CM | POA: Diagnosis not present

## 2014-03-10 DIAGNOSIS — R112 Nausea with vomiting, unspecified: Secondary | ICD-10-CM | POA: Diagnosis present

## 2014-03-10 MED ORDER — ONDANSETRON 4 MG PO TBDP
4.0000 mg | ORAL_TABLET | Freq: Once | ORAL | Status: AC
  Administered 2014-03-10: 4 mg via ORAL
  Filled 2014-03-10: qty 1

## 2014-03-10 MED ORDER — PROMETHAZINE HCL 25 MG PO TABS: 25.0000 mg | ORAL_TABLET | Freq: Four times a day (QID) | ORAL | Status: AC | PRN

## 2014-03-10 MED ORDER — PROMETHAZINE HCL 25 MG PO TABS: 25.0000 mg | ORAL_TABLET | Freq: Four times a day (QID) | ORAL | Status: DC | PRN

## 2014-03-10 MED ORDER — ONDANSETRON 4 MG PO TBDP
4.0000 mg | ORAL_TABLET | Freq: Three times a day (TID) | ORAL | Status: AC | PRN
Start: 2014-03-10 — End: ?

## 2014-03-10 MED ORDER — ONDANSETRON 4 MG PO TBDP: 4.0000 mg | ORAL_TABLET | Freq: Once | ORAL | Status: AC

## 2014-03-10 NOTE — ED Provider Notes (Signed)
CSN: [id number]     Arrival date & time 03/10/14  0708 History   This chart was scribed for Anthony Sorrow, MD by Chester Holstein, ED Scribe. This patient was seen in room APA07/APA07 and the patient's care was started at 7:56 AM.    Chief Complaint  Patient presents with  . Emesis   Patient is a 28 y.o. male presenting with vomiting. The history is provided by the patient. No language interpreter was used.  Emesis Associated symptoms: diarrhea and headaches (PMHx pituitary tumor)   Associated symptoms: no abdominal pain, no chills and no sore throat     HPI Comments: ADEMIDE SCHABERG is a 28 y.o. male who presents to the Emergency Department complaining of intermittent diarrhea.  Pt notes associated vomiting twice with onset this morning.  Pt notes diarrhea is brown and watery. He notes he has had 4 bowel movements. Pt had a right inguinal hernia repair in November.  Pt denies abdominal pain, hematemesis, and bloody stool.  Pt's PCP is Dr. Quintin Alto.   Past Medical History  Diagnosis Date  . Hypertension   . Pituitary tumor    Past Surgical History  Procedure Laterality Date  . Hand tendon surgery Right     pinky  . Inguinal hernia repair Right 01/29/2014    Procedure: HERNIA REPAIR INGUINAL ADULT;  Surgeon: Jamesetta So, MD;  Location: [facility];  Service: General;  Laterality: Right;  . Insertion of mesh Right 01/29/2014    Procedure: INSERTION OF MESH;  Surgeon: Jamesetta So, MD;  Location: [facility];  Service: General;  Laterality: Right;   History reviewed. No pertinent family history. History  Substance Use Topics  . Smoking status: Never Smoker   . Smokeless tobacco: Not on file  . Alcohol Use: No     Comment: occasional     Review of Systems  Constitutional: Negative for fever and chills.  HENT: Negative for rhinorrhea and sore throat.   Eyes: Negative for visual disturbance.  Respiratory: Negative for cough and shortness of breath.   Cardiovascular: Negative for  chest pain and leg swelling.  Gastrointestinal: Positive for nausea, vomiting and diarrhea. Negative for abdominal pain and blood in stool.  Genitourinary: Negative for dysuria.  Musculoskeletal: Negative for back pain.  Skin: Negative for rash.  Neurological: Positive for headaches (PMHx pituitary tumor).  Hematological: Does not bruise/bleed easily.  Psychiatric/Behavioral: Negative for confusion.      Allergies  Bee venom  Home Medications   Prior to Admission medications   Medication Sig Start Date End Date Taking? Authorizing Provider  acetaminophen (TYLENOL) 500 MG tablet Take 1,000 mg by mouth daily as needed for headache.   Yes Historical Provider, MD  cabergoline (DOSTINEX) 0.5 MG tablet Take 0.25 mg by mouth 2 (two) times a week. Mon and Thurs.   Yes Historical Provider, MD  ibuprofen (ADVIL,MOTRIN) 200 MG tablet Take 800 mg by mouth every 8 (eight) hours as needed for headache.   Yes Historical Provider, MD  ondansetron (ZOFRAN ODT) 4 MG disintegrating tablet Take 1 tablet (4 mg total) by mouth every 8 (eight) hours as needed. 03/10/14   Anthony Sorrow, MD  ondansetron (ZOFRAN-ODT) 4 MG disintegrating tablet Take 1 tablet (4 mg total) by mouth once. 03/10/14   Anthony Sorrow, MD  oxyCODONE-acetaminophen (PERCOCET) 7.5-325 MG per tablet Take 1-2 tablets by mouth every 4 (four) hours as needed. Patient taking differently: Take 1-2 tablets by mouth every 4 (four) hours as needed for pain.  01/29/14  Jamesetta So, MD  promethazine (PHENERGAN) 25 MG tablet Take 1 tablet (25 mg total) by mouth every 6 (six) hours as needed. 03/10/14   Anthony Sorrow, MD   BP 153/92 mmHg  Pulse 97  Temp(Src) 97.7 F (36.5 C) (Oral)  Resp 18  Ht 6' (1.829 m)  Wt 354 lb (160.573 kg)  BMI 48.00 kg/m2  SpO2 99% Physical Exam  Constitutional: He is oriented to person, place, and time. He appears well-developed and well-nourished.  HENT:  Head: Normocephalic.  Eyes: Conjunctivae are  normal.  Neck: Normal range of motion. Neck supple.  Cardiovascular: Normal rate, regular rhythm and normal heart sounds.  Exam reveals no friction rub.   No murmur heard. Pulmonary/Chest: Effort normal and breath sounds normal. No respiratory distress. He has no wheezes. He has no rales.  Abdominal: Soft. Bowel sounds are normal. He exhibits no distension. There is tenderness. There is no rebound and no guarding.  Musculoskeletal: Normal range of motion. He exhibits no edema.  Neurological: He is alert and oriented to person, place, and time. No cranial nerve deficit. He exhibits normal muscle tone. Coordination normal.  Skin: Skin is warm and dry.  Healing surgical scar in right groin, no sign of infection.  Psychiatric: He has a normal mood and affect. His behavior is normal.  Nursing note and vitals reviewed.   ED Course  Procedures (including critical care time) DIAGNOSTIC STUDIES: Oxygen Saturation is 99% on room air, normal by my interpretation.    COORDINATION OF CARE: 8:00 AM Discussed treatment plan with patient at beside, the patient agrees with the plan and has no further questions at this time.   Labs Review Labs Reviewed - No data to display  Imaging Review No results found.   EKG Interpretation None      MDM   Final diagnoses:  Gastroenteritis   nontoxic no acute distress. Patient was intermittent diarrhea for last several days. Emesis at work this morning. Patient improved in the emergency department. Given Zofran. Work note provided. No acute abdominal process.   I personally performed the services described in this documentation, which was scribed in my presence. The recorded information has been reviewed and is accurate.      Anthony Sorrow, MD 03/11/14 2207

## 2014-03-10 NOTE — ED Notes (Signed)
Patient with no complaints at this time. Respirations even and unlabored. Skin warm/dry. Discharge instructions reviewed with patient at this time. Patient given opportunity to voice concerns/ask questions. Patient discharged at this time and left Emergency Department with steady gait.   

## 2014-03-10 NOTE — ED Notes (Signed)
Pt reports intermittent diarrhea for last several days. Pt reports emesis since this morning at work. nad noted. Pt denies any abdominal pain at this time.

## 2014-03-10 NOTE — Discharge Instructions (Signed)
Take the antinausea medicine as directed. Would recommend bland diet. Return for any new or worse symptoms or persistent vomiting. Also as we discussed return for any worsening right lower quadrant abdominal pain. Take the Phenergan and/or Zofran as needed for nausea and vomiting. Work note provided.

## 2014-04-07 ENCOUNTER — Encounter (HOSPITAL_COMMUNITY): Payer: Self-pay | Admitting: Emergency Medicine

## 2014-04-07 ENCOUNTER — Emergency Department (HOSPITAL_COMMUNITY)
Admission: EM | Admit: 2014-04-07 | Discharge: 2014-04-07 | Disposition: A | Discharge status: Home / Self Care | Payer: Medicaid Other | Attending: Emergency Medicine | Admitting: Emergency Medicine

## 2014-04-07 DIAGNOSIS — Z86011 Personal history of benign neoplasm of the brain: Secondary | ICD-10-CM | POA: Insufficient documentation

## 2014-04-07 DIAGNOSIS — I10 Essential (primary) hypertension: Secondary | ICD-10-CM | POA: Diagnosis not present

## 2014-04-07 DIAGNOSIS — H109 Unspecified conjunctivitis: Secondary | ICD-10-CM | POA: Diagnosis not present

## 2014-04-07 NOTE — Discharge Instructions (Signed)

## 2014-04-07 NOTE — ED Notes (Signed)
Left eye, itching, pain, yellow drainage, Pt son and wife is being treated for pink eye

## 2014-04-07 NOTE — ED Provider Notes (Signed)
CSN: [id number]     Arrival date & time 04/07/14  0721 History  This chart was scribed for Hoy Morn, MD by Edison Simon, ED Scribe. This patient was seen in room APA17/APA17 and the patient's care was started at 7:49 AM.    Chief Complaint  Patient presents with  . Conjunctivitis   HPI  HPI Comments: Anthony Wilcox is a 29 y.o. male who presents to the Emergency Department complaining of eye redness, swelling, and itchiness with onset yesterday, worse with matting this morning. He states his work at a [employer], quantity would not let him back until he was evaluated. He notes his son and wife had the same symptoms. He denies other symptoms.     Past Medical History  Diagnosis Date  . Hypertension   . Pituitary tumor    Past Surgical History  Procedure Laterality Date  . Hand tendon surgery Right     pinky  . Inguinal hernia repair Right 01/29/2014    Procedure: HERNIA REPAIR INGUINAL ADULT;  Surgeon: Jamesetta So, MD;  Location: [facility];  Service: General;  Laterality: Right;  . Insertion of mesh Right 01/29/2014    Procedure: INSERTION OF MESH;  Surgeon: Jamesetta So, MD;  Location: [facility];  Service: General;  Laterality: Right;   No family history on file. History  Substance Use Topics  . Smoking status: Never Smoker   . Smokeless tobacco: Not on file  . Alcohol Use: No     Comment: occasional     Review of Systems A complete 10 system review of systems was obtained and all systems are negative except as noted in the HPI and PMH.    Allergies  Bee venom  Home Medications   Prior to Admission medications   Medication Sig Start Date End Date Taking? Authorizing Provider  acetaminophen (TYLENOL) 500 MG tablet Take 1,000 mg by mouth daily as needed for headache.    Historical Provider, MD  cabergoline (DOSTINEX) 0.5 MG tablet Take 0.25 mg by mouth 2 (two) times a week. Mon and Thurs.    Historical Provider, MD  ibuprofen (ADVIL,MOTRIN) 200 MG  tablet Take 800 mg by mouth every 8 (eight) hours as needed for headache.    Historical Provider, MD  ondansetron (ZOFRAN ODT) 4 MG disintegrating tablet Take 1 tablet (4 mg total) by mouth every 8 (eight) hours as needed. 03/10/14   Fredia Sorrow, MD  ondansetron (ZOFRAN-ODT) 4 MG disintegrating tablet Take 1 tablet (4 mg total) by mouth once. 03/10/14   Fredia Sorrow, MD  oxyCODONE-acetaminophen (PERCOCET) 7.5-325 MG per tablet Take 1-2 tablets by mouth every 4 (four) hours as needed. Patient taking differently: Take 1-2 tablets by mouth every 4 (four) hours as needed for pain.  01/29/14   Jamesetta So, MD  promethazine (PHENERGAN) 25 MG tablet Take 1 tablet (25 mg total) by mouth every 6 (six) hours as needed. 03/10/14   Fredia Sorrow, MD   BP 138/83 mmHg  Pulse 80  Temp(Src) 98.2 F (36.8 C) (Oral)  Resp 18  Ht 6' (1.829 m)  Wt 340 lb (154.223 kg)  BMI 46.10 kg/m2  SpO2 94% Physical Exam  Constitutional: He is oriented to person, place, and time. He appears well-developed and well-nourished.  HENT:  Head: Normocephalic.  Eyes: EOM are normal.  Bilateral conjunctive normal  Neck: Normal range of motion.  Pulmonary/Chest: Effort normal.  Abdominal: He exhibits no distension.  Musculoskeletal: Normal range of motion.  Neurological: He  is alert and oriented to person, place, and time.  Psychiatric: He has a normal mood and affect.  Nursing note and vitals reviewed.   ED Course  Procedures (including critical care time)  DIAGNOSTIC STUDIES: Oxygen Saturation is 94% on room air, adequate by my interpretation.    COORDINATION OF CARE: 7:50 AM Discussed treatment plan with patient at beside, the patient agrees with the plan and has no further questions at this time.   Labs Review Labs Reviewed - No data to display  Imaging Review No results found.   EKG Interpretation None      MDM   Final diagnoses:  Conjunctivitis, unspecified laterality    Well  appearing. No indication for abx. Suspect viral process  I personally performed the services described in this documentation, which was scribed in my presence. The recorded information has been reviewed and is accurate.      Hoy Morn, MD 04/07/14 [phone]

## 2014-07-19 ENCOUNTER — Emergency Department (HOSPITAL_COMMUNITY): Payer: Self-pay

## 2014-07-19 ENCOUNTER — Encounter (HOSPITAL_COMMUNITY): Payer: Self-pay | Admitting: Emergency Medicine

## 2014-07-19 ENCOUNTER — Emergency Department (HOSPITAL_COMMUNITY)
Admission: EM | Admit: 2014-07-19 | Discharge: 2014-07-19 | Discharge status: Against Medical Advice | Payer: Self-pay | Attending: Emergency Medicine | Admitting: Emergency Medicine

## 2014-07-19 DIAGNOSIS — Z79899 Other long term (current) drug therapy: Secondary | ICD-10-CM | POA: Insufficient documentation

## 2014-07-19 DIAGNOSIS — I10 Essential (primary) hypertension: Secondary | ICD-10-CM | POA: Insufficient documentation

## 2014-07-19 DIAGNOSIS — Z86018 Personal history of other benign neoplasm: Secondary | ICD-10-CM | POA: Insufficient documentation

## 2014-07-19 DIAGNOSIS — R1031 Right lower quadrant pain: Secondary | ICD-10-CM

## 2014-07-19 DIAGNOSIS — R1030 Lower abdominal pain, unspecified: Secondary | ICD-10-CM | POA: Insufficient documentation

## 2014-07-19 LAB — URINALYSIS, ROUTINE W REFLEX MICROSCOPIC
Bilirubin Urine: NEGATIVE
Glucose, UA: NEGATIVE mg/dL
Hgb urine dipstick: NEGATIVE
Ketones, ur: NEGATIVE mg/dL
NITRITE: NEGATIVE
PROTEIN: NEGATIVE mg/dL
Specific Gravity, Urine: 1.02 (ref 1.005–1.030)
Urobilinogen, UA: 1 mg/dL (ref 0.0–1.0)
pH: 6.5 (ref 5.0–8.0)

## 2014-07-19 LAB — URINE MICROSCOPIC-ADD ON

## 2014-07-19 MED ORDER — ONDANSETRON HCL 4 MG/2ML IJ SOLN: 4.0000 mg | Freq: Once | INTRAMUSCULAR | Status: DC

## 2014-07-19 MED ORDER — SODIUM CHLORIDE 0.9 % IV BOLUS (SEPSIS): 500.0000 mL | Freq: Once | INTRAVENOUS | Status: DC

## 2014-07-19 MED ORDER — SODIUM CHLORIDE 0.9 % IV SOLN
INTRAVENOUS | Status: DC
Start: 2014-07-19 — End: 2014-07-19

## 2014-07-19 NOTE — ED Provider Notes (Signed)
CSN: [id number]     Arrival date & time 07/19/14  1751 History   First MD Initiated Contact with Patient 07/19/14 1309     Chief Complaint  Patient presents with  . Groin Pain     (Consider location/radiation/quality/duration/timing/severity/associated sxs/prior Treatment) Patient is a 29 y.o. male presenting with groin pain. The history is provided by the patient.  Groin Pain Associated symptoms include abdominal pain. Pertinent negatives include no chest pain, no headaches and no shortness of breath.   patient status post hernia repair in November 2015 by Dr. Arnoldo Morale. Right inguinal hernia mesh was placed. Patient for the past 2-3 weeks has had recurrent pain in that area. Tender to touch. Some intermittent testicular pain but none now. No discharge. No swelling in the scrotal area. Denies fever denies nausea vomiting.  Past Medical History  Diagnosis Date  . Hypertension   . Pituitary tumor    Past Surgical History  Procedure Laterality Date  . Hand tendon surgery Right     pinky  . Inguinal hernia repair Right 01/29/2014    Procedure: HERNIA REPAIR INGUINAL ADULT;  Surgeon: Jamesetta So, MD;  Location: [facility];  Service: General;  Laterality: Right;  . Insertion of mesh Right 01/29/2014    Procedure: INSERTION OF MESH;  Surgeon: Jamesetta So, MD;  Location: [facility];  Service: General;  Laterality: Right;   History reviewed. No pertinent family history. History  Substance Use Topics  . Smoking status: Never Smoker   . Smokeless tobacco: Not on file  . Alcohol Use: No     Comment: occasional     Review of Systems  Constitutional: Negative for fever.  HENT: Negative for congestion.   Eyes: Negative for redness.  Respiratory: Negative for shortness of breath.   Cardiovascular: Negative for chest pain.  Gastrointestinal: Positive for abdominal pain. Negative for nausea and vomiting.  Genitourinary: Positive for scrotal swelling and testicular pain. Negative for dysuria,  hematuria, flank pain, discharge and penile pain.  Musculoskeletal: Negative for back pain.  Skin: Negative for rash.  Neurological: Negative for headaches.  Hematological: Does not bruise/bleed easily.  Psychiatric/Behavioral: Negative for confusion.      Allergies  Bee venom  Home Medications   Prior to Admission medications   Medication Sig Start Date End Date Taking? Authorizing Provider  acetaminophen (TYLENOL) 500 MG tablet Take 1,000 mg by mouth daily as needed for headache.    Historical Provider, MD  cabergoline (DOSTINEX) 0.5 MG tablet Take 0.25 mg by mouth 2 (two) times a week. Mon and Thurs.    Historical Provider, MD  ibuprofen (ADVIL,MOTRIN) 200 MG tablet Take 800 mg by mouth every 8 (eight) hours as needed for headache.    Historical Provider, MD  ondansetron (ZOFRAN ODT) 4 MG disintegrating tablet Take 1 tablet (4 mg total) by mouth every 8 (eight) hours as needed. 03/10/14   Fredia Sorrow, MD  ondansetron (ZOFRAN-ODT) 4 MG disintegrating tablet Take 1 tablet (4 mg total) by mouth once. 03/10/14   Fredia Sorrow, MD  oxyCODONE-acetaminophen (PERCOCET) 7.5-325 MG per tablet Take 1-2 tablets by mouth every 4 (four) hours as needed. Patient taking differently: Take 1-2 tablets by mouth every 4 (four) hours as needed for pain.  01/29/14   Aviva Signs Md, MD  promethazine (PHENERGAN) 25 MG tablet Take 1 tablet (25 mg total) by mouth every 6 (six) hours as needed. 03/10/14   Fredia Sorrow, MD   BP 135/84 mmHg  Pulse 78  Temp(Src) 98.3 F (  36.8 C) (Oral)  Resp 18  Ht 5\' 11"  (1.803 m)  Wt 310 lb (140.615 kg)  BMI 43.26 kg/m2  SpO2 100% Physical Exam  Constitutional: He is oriented to person, place, and time. He appears well-developed and well-nourished. No distress.  HENT:  Head: Normocephalic and atraumatic.  Mouth/Throat: Oropharynx is clear and moist.  Eyes: Conjunctivae and EOM are normal. Pupils are equal, round, and reactive to light.  Neck: Normal range  of motion.  Cardiovascular: Normal rate, regular rhythm and normal heart sounds.   No murmur heard. Pulmonary/Chest: Effort normal and breath sounds normal. No respiratory distress.  Abdominal: Soft. Bowel sounds are normal. He exhibits no distension. There is tenderness.  Tenderness right lower quadrant well-healed hernia incision. No obvious mass. No scrotal mass.  Genitourinary: Penis normal.  No scrotal mass no testicular tenderness no discharge from the penis.  Musculoskeletal: Normal range of motion.  Neurological: He is alert and oriented to person, place, and time. No cranial nerve deficit. He exhibits normal muscle tone. Coordination normal.  Skin: Skin is warm. No rash noted.  Nursing note and vitals reviewed.   ED Course  Procedures (including critical care time) Labs Review Labs Reviewed  URINALYSIS, ROUTINE W REFLEX MICROSCOPIC - Abnormal; Notable for the following:    Leukocytes, UA TRACE (*)    All other components within normal limits  URINE MICROSCOPIC-ADD ON - Abnormal; Notable for the following:    Squamous Epithelial / LPF FEW (*)    Bacteria, UA FEW (*)    All other components within normal limits  CBC WITH DIFFERENTIAL/PLATELET  BASIC METABOLIC PANEL   Results for orders placed or performed during the hospital encounter of 07/19/14  Urinalysis, Routine w reflex microscopic  Result Value Ref Range   Color, Urine YELLOW YELLOW   APPearance CLEAR CLEAR   Specific Gravity, Urine 1.020 1.005 - 1.030   pH 6.5 5.0 - 8.0   Glucose, UA NEGATIVE NEGATIVE mg/dL   Hgb urine dipstick NEGATIVE NEGATIVE   Bilirubin Urine NEGATIVE NEGATIVE   Ketones, ur NEGATIVE NEGATIVE mg/dL   Protein, ur NEGATIVE NEGATIVE mg/dL   Urobilinogen, UA 1.0 0.0 - 1.0 mg/dL   Nitrite NEGATIVE NEGATIVE   Leukocytes, UA TRACE (A) NEGATIVE  Urine microscopic-add on  Result Value Ref Range   Squamous Epithelial / LPF FEW (A) RARE   WBC, UA 3-6 <3 WBC/hpf   RBC / HPF 0-2 <3 RBC/hpf    Bacteria, UA FEW (A) RARE    Imaging Review No results found.   EKG Interpretation None      MDM   Final diagnoses:  Groin pain, right    The patient left AMA after being seen by me. Plan was to get the CT scan of the abdomen and pelvis to rule out recurrent hernia problem. Patient with right lower quadrant tenderness no testicular tenderness no very discharge from the penis. Patient status post hernia repair in November by Dr. Arnoldo Morale. Mesh was placed. Patient's been having pain in the area for 2-3 weeks. No nausea no vomiting.  There was tenderness on examination of the right lower quadrant area. Patient left without the providing any notification.    Fredia Sorrow, MD 07/19/14 [phone]

## 2014-07-19 NOTE — ED Notes (Signed)
Still unable to locate patient. Per charge nurse patient asked if he could go give his wife his keys, she stated that was at least half an hour ago. Will inform EDP

## 2014-07-19 NOTE — ED Notes (Signed)
Unable to locate patient. Patient not in room.

## 2014-07-19 NOTE — ED Notes (Signed)
PT states had hernia repair to right lower abdominal area on NOV/2015 and c/o lower right groin pain x2 weeks.

## 2014-08-04 ENCOUNTER — Emergency Department (HOSPITAL_COMMUNITY)
Admission: EM | Admit: 2014-08-04 | Discharge: 2014-08-04 | Disposition: A | Discharge status: Home / Self Care | Payer: Self-pay | Attending: Emergency Medicine | Admitting: Emergency Medicine

## 2014-08-04 ENCOUNTER — Encounter (HOSPITAL_COMMUNITY): Payer: Self-pay | Admitting: Emergency Medicine

## 2014-08-04 DIAGNOSIS — Z9889 Other specified postprocedural states: Secondary | ICD-10-CM | POA: Insufficient documentation

## 2014-08-04 DIAGNOSIS — Z86018 Personal history of other benign neoplasm: Secondary | ICD-10-CM | POA: Insufficient documentation

## 2014-08-04 DIAGNOSIS — R112 Nausea with vomiting, unspecified: Secondary | ICD-10-CM | POA: Insufficient documentation

## 2014-08-04 DIAGNOSIS — R197 Diarrhea, unspecified: Secondary | ICD-10-CM | POA: Insufficient documentation

## 2014-08-04 DIAGNOSIS — I10 Essential (primary) hypertension: Secondary | ICD-10-CM | POA: Insufficient documentation

## 2014-08-04 DIAGNOSIS — R630 Anorexia: Secondary | ICD-10-CM | POA: Insufficient documentation

## 2014-08-04 DIAGNOSIS — R109 Unspecified abdominal pain: Secondary | ICD-10-CM | POA: Insufficient documentation

## 2014-08-04 LAB — CBC WITH DIFFERENTIAL/PLATELET
Basophils Absolute: 0 10*3/uL (ref 0.0–0.1)
Basophils Relative: 0 % (ref 0–1)
EOS ABS: 0.2 10*3/uL (ref 0.0–0.7)
Eosinophils Relative: 2 % (ref 0–5)
HEMATOCRIT: 44.5 % (ref 39.0–52.0)
Hemoglobin: 14.4 g/dL (ref 13.0–17.0)
LYMPHS ABS: 2.6 10*3/uL (ref 0.7–4.0)
Lymphocytes Relative: 21 % (ref 12–46)
MCH: 27.4 pg (ref 26.0–34.0)
MCHC: 32.4 g/dL (ref 30.0–36.0)
MCV: 84.6 fL (ref 78.0–100.0)
Monocytes Absolute: 0.8 10*3/uL (ref 0.1–1.0)
Monocytes Relative: 6 % (ref 3–12)
NEUTROS ABS: 8.7 10*3/uL — AB (ref 1.7–7.7)
NEUTROS PCT: 71 % (ref 43–77)
PLATELETS: 307 10*3/uL (ref 150–400)
RBC: 5.26 MIL/uL (ref 4.22–5.81)
RDW: 14.4 % (ref 11.5–15.5)
WBC: 12.3 10*3/uL — ABNORMAL HIGH (ref 4.0–10.5)

## 2014-08-04 LAB — COMPREHENSIVE METABOLIC PANEL
ALK PHOS: 86 U/L (ref 38–126)
ALT: 24 U/L (ref 17–63)
AST: 20 U/L (ref 15–41)
Albumin: 4.1 g/dL (ref 3.5–5.0)
Anion gap: 8 (ref 5–15)
BUN: 13 mg/dL (ref 6–20)
CHLORIDE: 104 mmol/L (ref 101–111)
CO2: 27 mmol/L (ref 22–32)
CREATININE: 0.82 mg/dL (ref 0.61–1.24)
Calcium: 9.1 mg/dL (ref 8.9–10.3)
Glucose, Bld: 109 mg/dL — ABNORMAL HIGH (ref 65–99)
POTASSIUM: 4.5 mmol/L (ref 3.5–5.1)
Sodium: 139 mmol/L (ref 135–145)
Total Bilirubin: 0.5 mg/dL (ref 0.3–1.2)
Total Protein: 7.5 g/dL (ref 6.5–8.1)

## 2014-08-04 LAB — URINALYSIS, ROUTINE W REFLEX MICROSCOPIC
Bilirubin Urine: NEGATIVE
Glucose, UA: NEGATIVE mg/dL
Hgb urine dipstick: NEGATIVE
Ketones, ur: NEGATIVE mg/dL
LEUKOCYTES UA: NEGATIVE
Nitrite: NEGATIVE
PH: 6 (ref 5.0–8.0)
Protein, ur: NEGATIVE mg/dL
Specific Gravity, Urine: 1.02 (ref 1.005–1.030)
Urobilinogen, UA: 0.2 mg/dL (ref 0.0–1.0)

## 2014-08-04 MED ORDER — ONDANSETRON 4 MG PO TBDP
4.0000 mg | ORAL_TABLET | Freq: Once | ORAL | Status: AC
  Administered 2014-08-04: 4 mg via ORAL
  Filled 2014-08-04: qty 1

## 2014-08-04 MED ORDER — ONDANSETRON HCL 4 MG PO TABS: 4.0000 mg | ORAL_TABLET | Freq: Four times a day (QID) | ORAL | Status: AC

## 2014-08-04 NOTE — Discharge Instructions (Signed)

## 2014-08-04 NOTE — ED Notes (Signed)
Pt states that he had n/v/d all weekend and went to work this morning and was told that he needed a note to return stating he was not contagious.

## 2014-08-04 NOTE — ED Provider Notes (Signed)
CSN: [id number]     Arrival date & time 08/04/14  0703 History   First MD Initiated Contact with Patient 08/04/14 [phone]     Chief Complaint  Patient presents with  . Emesis     (Consider location/radiation/quality/duration/timing/severity/associated sxs/prior Treatment) HPI Comments: Patient with nausea, vomiting, diarrhea onset 3 days ago, improving.  States 2 episodes of vomiting yesterday, 8 episodes of loose stools.  No fever.  Wife with similar symptoms which has improved.  No recent travel. No antibiotic use. States vomiting and diarrhea was worse over the weekend.  No chest pain or SOB.  No abdominal pain.  No testicular pain.  No urinary symptoms.  Hx pituitary tumor and hypertension.  Previous inguinal hernia repair. Patient states presenting today because he needs a note to go back to work.  The history is provided by the patient.    Past Medical History  Diagnosis Date  . Hypertension   . Pituitary tumor    Past Surgical History  Procedure Laterality Date  . Hand tendon surgery Right     pinky  . Inguinal hernia repair Right 01/29/2014    Procedure: HERNIA REPAIR INGUINAL ADULT;  Surgeon: Jamesetta So, MD;  Location: [facility];  Service: General;  Laterality: Right;  . Insertion of mesh Right 01/29/2014    Procedure: INSERTION OF MESH;  Surgeon: Jamesetta So, MD;  Location: [facility];  Service: General;  Laterality: Right;   History reviewed. No pertinent family history. History  Substance Use Topics  . Smoking status: Never Smoker   . Smokeless tobacco: Not on file  . Alcohol Use: No     Comment: occasional     Review of Systems  Constitutional: Positive for activity change and appetite change. Negative for fever.  HENT: Negative for congestion.   Eyes: Negative for visual disturbance.  Respiratory: Negative for cough, chest tightness and shortness of breath.   Cardiovascular: Negative for chest pain.  Gastrointestinal: Positive for nausea, vomiting and abdominal  pain.  Genitourinary: Negative for dysuria, urgency, hematuria and testicular pain.  Musculoskeletal: Negative for myalgias and arthralgias.  Skin: Negative for rash.  Neurological: Negative for dizziness, weakness, light-headedness and headaches.  A complete 10 system review of systems was obtained and all systems are negative except as noted in the HPI and PMH.      Allergies  Bee venom  Home Medications   Prior to Admission medications   Medication Sig Start Date End Date Taking? Authorizing Provider  acetaminophen (TYLENOL) 500 MG tablet Take 1,000 mg by mouth daily as needed for headache.   Yes Historical Provider, MD  cabergoline (DOSTINEX) 0.5 MG tablet Take 0.25 mg by mouth 2 (two) times a week. Mon and Thurs.   Yes Historical Provider, MD  ondansetron (ZOFRAN ODT) 4 MG disintegrating tablet Take 1 tablet (4 mg total) by mouth every 8 (eight) hours as needed. Patient not taking: Reported on 08/04/2014 03/10/14   Fredia Sorrow, MD  ondansetron (ZOFRAN) 4 MG tablet Take 1 tablet (4 mg total) by mouth every 6 (six) hours. 08/04/14   Ezequiel Essex, MD  ondansetron (ZOFRAN-ODT) 4 MG disintegrating tablet Take 1 tablet (4 mg total) by mouth once. Patient not taking: Reported on 08/04/2014 03/10/14   Fredia Sorrow, MD  oxyCODONE-acetaminophen (PERCOCET) 7.5-325 MG per tablet Take 1-2 tablets by mouth every 4 (four) hours as needed. Patient not taking: Reported on 08/04/2014 01/29/14   Aviva Signs Md, MD  promethazine (PHENERGAN) 25 MG tablet Take 1 tablet (25  mg total) by mouth every 6 (six) hours as needed. Patient not taking: Reported on 08/04/2014 03/10/14   Fredia Sorrow, MD   BP 134/81 mmHg  Pulse 82  Temp(Src) 98.1 F (36.7 C) (Oral)  Resp 24  Ht 6' (1.829 m)  Wt 350 lb (158.759 kg)  BMI 47.46 kg/m2  SpO2 100% Physical Exam  Constitutional: He is oriented to person, place, and time. He appears well-developed and well-nourished. No distress.  HENT:  Head:  Normocephalic and atraumatic.  Mouth/Throat: Oropharynx is clear and moist. No oropharyngeal exudate.  Moist mucus membranes  Eyes: Conjunctivae and EOM are normal. Pupils are equal, round, and reactive to light.  Neck: Normal range of motion. Neck supple.  No meningismus.  Cardiovascular: Normal rate, regular rhythm, normal heart sounds and intact distal pulses.   No murmur heard. Pulmonary/Chest: Effort normal and breath sounds normal. No respiratory distress.  Abdominal: Soft. There is no tenderness. There is no rebound and no guarding.  Obese abdomen, NTND.  Genitourinary:  No testicular tenderness  Musculoskeletal: Normal range of motion. He exhibits no edema or tenderness.  No CVAT  Neurological: He is alert and oriented to person, place, and time. No cranial nerve deficit. He exhibits normal muscle tone. Coordination normal.  No ataxia on finger to nose bilaterally. No pronator drift. 5/5 strength throughout. CN 2-12 intact. Negative Romberg. Equal grip strength. Sensation intact. Gait is normal.   Skin: Skin is warm.  Psychiatric: He has a normal mood and affect. His behavior is normal.  Nursing note and vitals reviewed.   ED Course  Procedures (including critical care time) Labs Review Labs Reviewed  CBC WITH DIFFERENTIAL/PLATELET - Abnormal; Notable for the following:    WBC 12.3 (*)    Neutro Abs 8.7 (*)    All other components within normal limits  COMPREHENSIVE METABOLIC PANEL - Abnormal; Notable for the following:    Glucose, Bld 109 (*)    All other components within normal limits  URINALYSIS, ROUTINE W REFLEX MICROSCOPIC    Imaging Review No results found.   EKG Interpretation None      MDM   Final diagnoses:  Nausea vomiting and diarrhea   Nausea vomiting diarrhea for the past 3 days that is improving. No fever. No abdominal tenderness. Patient presenting for note to go back to work.  Labs show mild leukocytosis which appears to be chronic.  Urinalysis is negative. Abdomen is soft and nontender. Patient tolerating by mouth in the ED. Suspect viral etiology of his vomiting and diarrhea. Continue oral hydration. Given antiemetics. Follow up with PCP. Return precautions discussed.   BP 134/81 mmHg  Pulse 82  Temp(Src) 98.1 F (36.7 C) (Oral)  Resp 24  Ht 6' (1.829 m)  Wt 350 lb (158.759 kg)  BMI 47.46 kg/m2  SpO2 100%   Ezequiel Essex, MD 08/04/14 1538

## 2014-09-30 ENCOUNTER — Encounter (HOSPITAL_COMMUNITY): Payer: Self-pay | Admitting: Emergency Medicine

## 2014-09-30 ENCOUNTER — Emergency Department (HOSPITAL_COMMUNITY)
Admission: EM | Admit: 2014-09-30 | Discharge: 2014-09-30 | Disposition: A | Discharge status: Home / Self Care | Payer: Self-pay | Attending: Emergency Medicine | Admitting: Emergency Medicine

## 2014-09-30 DIAGNOSIS — E669 Obesity, unspecified: Secondary | ICD-10-CM | POA: Insufficient documentation

## 2014-09-30 DIAGNOSIS — Y999 Unspecified external cause status: Secondary | ICD-10-CM | POA: Insufficient documentation

## 2014-09-30 DIAGNOSIS — Z8639 Personal history of other endocrine, nutritional and metabolic disease: Secondary | ICD-10-CM | POA: Insufficient documentation

## 2014-09-30 DIAGNOSIS — Y929 Unspecified place or not applicable: Secondary | ICD-10-CM | POA: Insufficient documentation

## 2014-09-30 DIAGNOSIS — X58XXXA Exposure to other specified factors, initial encounter: Secondary | ICD-10-CM | POA: Insufficient documentation

## 2014-09-30 DIAGNOSIS — T63441A Toxic effect of venom of bees, accidental (unintentional), initial encounter: Secondary | ICD-10-CM | POA: Insufficient documentation

## 2014-09-30 DIAGNOSIS — I10 Essential (primary) hypertension: Secondary | ICD-10-CM | POA: Insufficient documentation

## 2014-09-30 DIAGNOSIS — T7840XA Allergy, unspecified, initial encounter: Secondary | ICD-10-CM | POA: Insufficient documentation

## 2014-09-30 DIAGNOSIS — Y939 Activity, unspecified: Secondary | ICD-10-CM | POA: Insufficient documentation

## 2014-09-30 MED ORDER — FAMOTIDINE 20 MG PO TABS
40.0000 mg | ORAL_TABLET | Freq: Once | ORAL | Status: AC
  Administered 2014-09-30: 40 mg via ORAL
  Filled 2014-09-30: qty 2

## 2014-09-30 MED ORDER — DEXAMETHASONE SODIUM PHOSPHATE 10 MG/ML IJ SOLN
20.0000 mg | Freq: Once | INTRAMUSCULAR | Status: AC
  Administered 2014-09-30: 20 mg
  Filled 2014-09-30: qty 2

## 2014-09-30 MED ORDER — DIPHENHYDRAMINE HCL 25 MG PO CAPS
50.0000 mg | ORAL_CAPSULE | Freq: Once | ORAL | Status: AC
  Administered 2014-09-30: 50 mg via ORAL
  Filled 2014-09-30: qty 2

## 2014-09-30 NOTE — ED Notes (Signed)
Pt. Resting in bed. Pt. Given drink per pt. Request.Pt. Reports throat is no longer scratchy.

## 2014-09-30 NOTE — ED Provider Notes (Signed)
CSN: [id number]     Arrival date & time 09/30/14  2219 History   First MD Initiated Contact with Patient 09/30/14 2235     Chief Complaint  Patient presents with  . Allergic Reaction     (Consider location/radiation/quality/duration/timing/severity/associated sxs/prior Treatment) HPI.... Patient stung on left index finger by a bee a short time ago. 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 No respiratory distress. No obvious rash or hives. Severity is mild. He has taken nothing at home.  Past Medical History  Diagnosis Date  . Hypertension   . Pituitary tumor    Past Surgical History  Procedure Laterality Date  . Hand tendon surgery Right     pinky  . Inguinal hernia repair Right 01/29/2014    Procedure: HERNIA REPAIR INGUINAL ADULT;  Surgeon: Jamesetta So, MD;  Location: [facility];  Service: General;  Laterality: Right;  . Insertion of mesh Right 01/29/2014    Procedure: INSERTION OF MESH;  Surgeon: Jamesetta So, MD;  Location: [facility];  Service: General;  Laterality: Right;   No family history on file. History  Substance Use Topics  . Smoking status: Never Smoker   . Smokeless tobacco: Not on file  . Alcohol Use: No     Comment: occasional     Review of Systems  All other systems reviewed and are negative.     Allergies  Bee venom  Home Medications   Prior to Admission medications   Medication Sig Start Date End Date Taking? Authorizing Provider  acetaminophen (TYLENOL) 500 MG tablet Take 1,000 mg by mouth daily as needed for headache.    Historical Provider, MD  cabergoline (DOSTINEX) 0.5 MG tablet Take 0.25 mg by mouth 2 (two) times a week. Mon and Thurs.    Historical Provider, MD  ondansetron (ZOFRAN ODT) 4 MG disintegrating tablet Take 1 tablet (4 mg total) by mouth every 8 (eight) hours as needed. Patient not taking: Reported on 08/04/2014 03/10/14   Fredia Sorrow, MD  ondansetron (ZOFRAN) 4 MG tablet  Take 1 tablet (4 mg total) by mouth every 6 (six) hours. 08/04/14   Ezequiel Essex, MD  ondansetron (ZOFRAN-ODT) 4 MG disintegrating tablet Take 1 tablet (4 mg total) by mouth once. Patient not taking: Reported on 08/04/2014 03/10/14   Fredia Sorrow, MD  oxyCODONE-acetaminophen (PERCOCET) 7.5-325 MG per tablet Take 1-2 tablets by mouth every 4 (four) hours as needed. Patient not taking: Reported on 08/04/2014 01/29/14   Aviva Signs, MD  promethazine (PHENERGAN) 25 MG tablet Take 1 tablet (25 mg total) by mouth every 6 (six) hours as needed. Patient not taking: Reported on 08/04/2014 03/10/14   Fredia Sorrow, MD   BP 168/98 mmHg  Pulse 101  Temp(Src) 99.4 F (37.4 C) (Oral)  Resp 20  Ht 6' (1.829 m)  Wt 322 lb (146.058 kg)  BMI 43.66 kg/m2  SpO2 96% Physical Exam  Constitutional: He is oriented to person, place, and time.  obese  HENT:  Head: Normocephalic and atraumatic.  Eyes: Conjunctivae and EOM are normal. Pupils are equal, round, and reactive to light.  Neck: Normal range of motion. Neck supple.  Cardiovascular: Normal rate and regular rhythm.   Pulmonary/Chest: Effort normal and breath sounds normal.  Abdominal: Soft. Bowel sounds are normal.  Musculoskeletal: Normal range of motion.  Neurological: He is alert and oriented to person, place, and time.  Skin:  Slight erythema at the tip of the left  index finger  Psychiatric: He has a normal mood and affect. His behavior is normal.  Nursing note and vitals reviewed.   ED Course  Procedures (including critical care time) Labs Review Labs Reviewed - No data to display  Imaging Review No results found.   EKG Interpretation None      MDM   Final diagnoses:  Allergic reaction, initial encounter    Patient feels much better after IM Decadron, oral Benadryl, oral Pepcid. He was observed for approximately one hour with no adverse reactions. Discharge medication EpiPen    Nat Christen, MD 10/01/14 [phone]

## 2014-09-30 NOTE — ED Notes (Addendum)
Pt. Reports being stung on left index finger by bee. Pt. Reports previous allergic reaction to bee sting where he had swelling when he was 29 years old. Pt. Reports scratchy throat. No breathing difficulties noted. No distress noted.

## 2014-09-30 NOTE — Discharge Instructions (Signed)
Can take Benadryl 4-6 hours. Prescription for EpiPen.
